# Patient Record
Sex: Female | Born: 1960 | Race: Black or African American | Hispanic: No | Marital: Married | Smoking: Current every day smoker
Health system: Southern US, Community
[De-identification: ages and names within clinical notes are randomized; demographics above are authoritative.]

## PROBLEM LIST (undated history)

## (undated) DIAGNOSIS — I82402 Acute embolism and thrombosis of unspecified deep veins of left lower extremity: Secondary | ICD-10-CM

## (undated) DIAGNOSIS — E669 Obesity, unspecified: Secondary | ICD-10-CM

## (undated) DIAGNOSIS — L089 Local infection of the skin and subcutaneous tissue, unspecified: Secondary | ICD-10-CM

## (undated) DIAGNOSIS — I10 Essential (primary) hypertension: Secondary | ICD-10-CM

## (undated) DIAGNOSIS — J45909 Unspecified asthma, uncomplicated: Secondary | ICD-10-CM

## (undated) DIAGNOSIS — B958 Unspecified staphylococcus as the cause of diseases classified elsewhere: Secondary | ICD-10-CM

## (undated) HISTORY — DX: Unspecified staphylococcus as the cause of diseases classified elsewhere: B95.8

## (undated) HISTORY — DX: Obesity, unspecified: E66.9

## (undated) HISTORY — DX: Acute embolism and thrombosis of unspecified deep veins of left lower extremity: I82.402

## (undated) HISTORY — DX: Essential (primary) hypertension: I10

## (undated) HISTORY — DX: Local infection of the skin and subcutaneous tissue, unspecified: L08.9

---

## 2000-12-23 ENCOUNTER — Other Ambulatory Visit: Admission: RE | Admit: 2000-12-23 | Discharge: 2000-12-23 | Payer: Self-pay | Admitting: Obstetrics and Gynecology

## 2001-02-01 ENCOUNTER — Encounter: Payer: Self-pay | Admitting: *Deleted

## 2001-02-01 ENCOUNTER — Ambulatory Visit (HOSPITAL_COMMUNITY): Admission: RE | Admit: 2001-02-01 | Discharge: 2001-02-01 | Payer: Self-pay | Admitting: *Deleted

## 2001-02-11 ENCOUNTER — Ambulatory Visit (HOSPITAL_COMMUNITY): Admission: RE | Admit: 2001-02-11 | Discharge: 2001-02-11 | Payer: Self-pay | Admitting: *Deleted

## 2001-02-11 ENCOUNTER — Encounter: Payer: Self-pay | Admitting: *Deleted

## 2001-06-16 ENCOUNTER — Encounter: Payer: Self-pay | Admitting: Obstetrics and Gynecology

## 2001-06-16 ENCOUNTER — Ambulatory Visit (HOSPITAL_COMMUNITY): Admission: RE | Admit: 2001-06-16 | Discharge: 2001-06-16 | Payer: Self-pay | Admitting: *Deleted

## 2001-06-27 ENCOUNTER — Inpatient Hospital Stay (HOSPITAL_COMMUNITY): Admission: AD | Admit: 2001-06-27 | Discharge: 2001-06-27 | Payer: Self-pay

## 2001-07-14 ENCOUNTER — Inpatient Hospital Stay (HOSPITAL_COMMUNITY): Admission: AD | Admit: 2001-07-14 | Discharge: 2001-07-16 | Payer: Self-pay | Admitting: Obstetrics and Gynecology

## 2004-12-07 DIAGNOSIS — B958 Unspecified staphylococcus as the cause of diseases classified elsewhere: Secondary | ICD-10-CM

## 2004-12-07 DIAGNOSIS — L089 Local infection of the skin and subcutaneous tissue, unspecified: Secondary | ICD-10-CM

## 2004-12-07 DIAGNOSIS — I82402 Acute embolism and thrombosis of unspecified deep veins of left lower extremity: Secondary | ICD-10-CM

## 2004-12-07 HISTORY — DX: Local infection of the skin and subcutaneous tissue, unspecified: L08.9

## 2004-12-07 HISTORY — PX: TUBAL LIGATION: SHX77

## 2004-12-07 HISTORY — DX: Acute embolism and thrombosis of unspecified deep veins of left lower extremity: I82.402

## 2004-12-07 HISTORY — PX: ABDOMINAL EXPLORATION SURGERY: SHX538

## 2004-12-07 HISTORY — DX: Unspecified staphylococcus as the cause of diseases classified elsewhere: B95.8

## 2005-07-16 ENCOUNTER — Encounter (INDEPENDENT_AMBULATORY_CARE_PROVIDER_SITE_OTHER): Payer: Self-pay | Admitting: Specialist

## 2005-07-16 ENCOUNTER — Inpatient Hospital Stay (HOSPITAL_COMMUNITY): Admission: AD | Admit: 2005-07-16 | Discharge: 2005-07-19 | Payer: Self-pay | Admitting: Obstetrics & Gynecology

## 2005-07-27 ENCOUNTER — Encounter (INDEPENDENT_AMBULATORY_CARE_PROVIDER_SITE_OTHER): Payer: Self-pay | Admitting: *Deleted

## 2005-07-28 ENCOUNTER — Inpatient Hospital Stay (HOSPITAL_COMMUNITY): Admission: EM | Admit: 2005-07-28 | Discharge: 2005-08-12 | Payer: Self-pay | Admitting: Emergency Medicine

## 2005-07-28 ENCOUNTER — Ambulatory Visit: Payer: Self-pay | Admitting: Internal Medicine

## 2005-08-19 ENCOUNTER — Ambulatory Visit: Payer: Self-pay | Admitting: Sports Medicine

## 2005-08-19 ENCOUNTER — Inpatient Hospital Stay (HOSPITAL_COMMUNITY): Admission: AD | Admit: 2005-08-19 | Discharge: 2005-08-21 | Payer: Self-pay | Admitting: Surgery

## 2005-08-24 ENCOUNTER — Ambulatory Visit: Payer: Self-pay | Admitting: Sports Medicine

## 2005-08-26 ENCOUNTER — Ambulatory Visit: Payer: Self-pay | Admitting: Family Medicine

## 2005-10-15 ENCOUNTER — Ambulatory Visit: Payer: Self-pay | Admitting: Family Medicine

## 2005-10-23 ENCOUNTER — Ambulatory Visit: Payer: Self-pay | Admitting: Family Medicine

## 2005-11-03 ENCOUNTER — Ambulatory Visit: Payer: Self-pay | Admitting: Family Medicine

## 2005-11-11 ENCOUNTER — Ambulatory Visit: Payer: Self-pay | Admitting: Family Medicine

## 2005-11-18 ENCOUNTER — Ambulatory Visit: Payer: Self-pay | Admitting: Family Medicine

## 2005-11-25 ENCOUNTER — Ambulatory Visit: Payer: Self-pay | Admitting: Family Medicine

## 2005-12-10 ENCOUNTER — Ambulatory Visit: Payer: Self-pay | Admitting: Family Medicine

## 2005-12-21 ENCOUNTER — Ambulatory Visit: Payer: Self-pay | Admitting: Family Medicine

## 2005-12-29 ENCOUNTER — Ambulatory Visit: Payer: Self-pay | Admitting: Family Medicine

## 2006-01-06 ENCOUNTER — Ambulatory Visit: Payer: Self-pay | Admitting: Family Medicine

## 2006-01-14 ENCOUNTER — Ambulatory Visit: Payer: Self-pay | Admitting: Family Medicine

## 2006-01-21 ENCOUNTER — Ambulatory Visit: Payer: Self-pay | Admitting: Family Medicine

## 2006-01-29 ENCOUNTER — Ambulatory Visit: Payer: Self-pay | Admitting: Family Medicine

## 2006-04-22 ENCOUNTER — Ambulatory Visit: Payer: Self-pay | Admitting: Family Medicine

## 2006-05-14 ENCOUNTER — Encounter: Payer: Self-pay | Admitting: Vascular Surgery

## 2006-05-14 ENCOUNTER — Ambulatory Visit (HOSPITAL_COMMUNITY): Admission: RE | Admit: 2006-05-14 | Discharge: 2006-05-14 | Payer: Self-pay | Admitting: Family Medicine

## 2007-01-25 ENCOUNTER — Ambulatory Visit: Payer: Self-pay | Admitting: Family Medicine

## 2007-01-27 ENCOUNTER — Ambulatory Visit: Payer: Self-pay | Admitting: Family Medicine

## 2007-02-03 DIAGNOSIS — M7120 Synovial cyst of popliteal space [Baker], unspecified knee: Secondary | ICD-10-CM | POA: Insufficient documentation

## 2007-02-04 ENCOUNTER — Ambulatory Visit: Payer: Self-pay | Admitting: Family Medicine

## 2007-02-18 ENCOUNTER — Ambulatory Visit: Payer: Self-pay | Admitting: Family Medicine

## 2007-02-18 DIAGNOSIS — I10 Essential (primary) hypertension: Secondary | ICD-10-CM

## 2007-05-27 ENCOUNTER — Ambulatory Visit: Payer: Self-pay | Admitting: Family Medicine

## 2007-05-27 ENCOUNTER — Encounter: Admission: RE | Admit: 2007-05-27 | Discharge: 2007-05-27 | Payer: Self-pay | Admitting: Sports Medicine

## 2007-05-27 ENCOUNTER — Telehealth: Payer: Self-pay | Admitting: *Deleted

## 2007-05-27 DIAGNOSIS — M2140 Flat foot [pes planus] (acquired), unspecified foot: Secondary | ICD-10-CM | POA: Insufficient documentation

## 2007-05-27 DIAGNOSIS — M21619 Bunion of unspecified foot: Secondary | ICD-10-CM | POA: Insufficient documentation

## 2007-05-27 DIAGNOSIS — M79609 Pain in unspecified limb: Secondary | ICD-10-CM | POA: Insufficient documentation

## 2007-05-30 ENCOUNTER — Encounter (INDEPENDENT_AMBULATORY_CARE_PROVIDER_SITE_OTHER): Payer: Self-pay | Admitting: *Deleted

## 2007-05-30 DIAGNOSIS — IMO0002 Reserved for concepts with insufficient information to code with codable children: Secondary | ICD-10-CM | POA: Insufficient documentation

## 2007-05-30 DIAGNOSIS — M25569 Pain in unspecified knee: Secondary | ICD-10-CM | POA: Insufficient documentation

## 2007-05-30 DIAGNOSIS — M1710 Unilateral primary osteoarthritis, unspecified knee: Secondary | ICD-10-CM

## 2007-05-31 ENCOUNTER — Telehealth: Payer: Self-pay | Admitting: *Deleted

## 2007-06-01 ENCOUNTER — Encounter (INDEPENDENT_AMBULATORY_CARE_PROVIDER_SITE_OTHER): Payer: Self-pay | Admitting: *Deleted

## 2007-06-02 ENCOUNTER — Encounter (INDEPENDENT_AMBULATORY_CARE_PROVIDER_SITE_OTHER): Payer: Self-pay | Admitting: *Deleted

## 2007-06-03 ENCOUNTER — Ambulatory Visit: Payer: Self-pay | Admitting: Family Medicine

## 2007-06-15 ENCOUNTER — Ambulatory Visit: Payer: Self-pay | Admitting: Sports Medicine

## 2007-06-17 ENCOUNTER — Ambulatory Visit: Payer: Self-pay | Admitting: Family Medicine

## 2007-06-17 DIAGNOSIS — L723 Sebaceous cyst: Secondary | ICD-10-CM

## 2007-06-17 DIAGNOSIS — M629 Disorder of muscle, unspecified: Secondary | ICD-10-CM

## 2007-08-09 ENCOUNTER — Emergency Department (HOSPITAL_COMMUNITY): Admission: EM | Admit: 2007-08-09 | Discharge: 2007-08-09 | Payer: Self-pay | Admitting: Emergency Medicine

## 2009-09-14 ENCOUNTER — Encounter (INDEPENDENT_AMBULATORY_CARE_PROVIDER_SITE_OTHER): Payer: Self-pay | Admitting: *Deleted

## 2009-09-14 DIAGNOSIS — F17200 Nicotine dependence, unspecified, uncomplicated: Secondary | ICD-10-CM | POA: Insufficient documentation

## 2011-02-16 ENCOUNTER — Inpatient Hospital Stay (INDEPENDENT_AMBULATORY_CARE_PROVIDER_SITE_OTHER)
Admission: RE | Admit: 2011-02-16 | Discharge: 2011-02-16 | Disposition: A | Discharge status: Home / Self Care | Payer: Self-pay | Source: Ambulatory Visit | Attending: Emergency Medicine | Admitting: Emergency Medicine

## 2011-02-16 DIAGNOSIS — I10 Essential (primary) hypertension: Secondary | ICD-10-CM

## 2011-04-24 NOTE — Discharge Summary (Signed)
NAMELANISHA, STEPANIAN                ACCOUNT NO.:  [id number]   MEDICAL RECORD NO.:  [id number]          PATIENT TYPE:  INP   LOCATION:  5705                         FACILITY:  [facility]   PHYSICIAN:  Melina Fiddler, MD DATE OF BIRTH:  1961/02/21   DATE OF ADMISSION:  08/19/2005  DATE OF DISCHARGE:  08/21/2005                                 DISCHARGE SUMMARY   DIAGNOSIS:  1.  Deep venous thrombosis.  2.  Abdominal wound status post cesarean section four weeks ago.  3.  Hypertension.  4.  Anemia.   MEDICATIONS:  1.  Vicodin 5/500 take 1 tab p.o. q.6h. p.r.n. pain.  2.  Iron 325 mg 1 tab daily.  3.  Senokot S 1 tab daily.  4.  Lovenox 130 subcutaneous twice daily.  5.  Coumadin 7.5 mg p.o. daily.  6.  HCTZ 12.5 mg 1 tab daily.   PROCEDURES:  None.   HOSPITAL COURSE:  The patient was admitted on August 19, 2005, for  swelling of her leg and DVT diagnosed at [facility] by her surgeon, Dr.  Gerrit Friends.  She has a very large abdominal wound extending from just right of  her midline to the axillary line just beneath her pannus as she is fairly  obese.  This is currently dressed with twice a day wet to dry.  A Wound VAC  was considered, but it was determined that it would be difficult to achieve  an appropriate seal.  She was initially started on Coumadin and heparin and  the next day after admission, she was switched over to Lovenox.  She  received samples from the family practice center.  She got teaching from the  pharmacist and felt comfortable administering her own Lovenox.  She will  follow up Coumadin check on September 18.  Admission labs reveal hemoglobin  8, all other labs noncontributory.  The patient developed some high blood  pressure in the hospital and was started on hydrochlorothiazide 12.5 mg.   Problem 1:  Deep venous thrombosis.  The patient was sent out on Lovenox 130  mg subcu b.i.d. and Coumadin 7.5 mg daily, she will follow up on Monday,  September 18, for a  PT check at the [facility].  Her INR was not  therapeutic at discharge.   Problem 2:  Abdominal wound.  The patient will continue the twice daily  wound changes from [facility] nurse, wet to dry.  This problem will be  followed by her surgeon, Dr. Gerrit Friends.   Problem 3:  Hypertension.  The patient will continue 12.5 mg HCTZ daily.   Problem 4:  Anemia.  The patient was sent out on iron 325 mg 1 tab daily.   DISCHARGE LABORATORY DATA:  INR subtherapeutic.  Hemoglobin 8.   FOLLOW UP:  The patient will follow up with Dr. Gerrit Friends in four weeks, she  already has an appointment scheduled.  She will follow up with Dr. Irving Burton  on September 20 at 3 p.m.  She will get a PT/INR checked in the morning on  August 24, 2005.  Angeline Slim, M.D.    ______________________________  Melina Fiddler, MD    AL/MEDQ  D:  08/21/2005  T:  08/21/2005  Job:  [id number]   cc:   Velora Heckler, MD  [address] [place]  [place] [address]

## 2011-04-24 NOTE — Op Note (Signed)
NAMEBRIGHID, [name]                ACCOUNT NO.:  [id number]   MEDICAL RECORD NO.:  [id number]          PATIENT TYPE:  INP   LOCATION:  1408                         FACILITY:  [facility]   PHYSICIAN:  Velora Heckler, MD      DATE OF BIRTH:  [birth year]   DATE OF PROCEDURE:  07/31/2005  DATE OF DISCHARGE:                                 OPERATIVE REPORT   PREOPERATIVE DIAGNOSIS:  Necrotizing abdominal wall wound infection.   POSTOPERATIVE DIAGNOSIS:  Necrotizing abdominal wall wound infection.   PROCEDURE:  Debridement of skin and subcutaneous tissue, drainage of  subcutaneous abscess, open packing of extensive abdominal wall wound.   SURGEON:  Velora Heckler, M.D.   ANESTHESIA:  General.   ESTIMATED BLOOD LOSS:  Minimal.   PREPARATION:  Betadine.   COMPLICATIONS:  None.   INDICATIONS:  The patient is a 50 year old black female with a complex  necrotizing abdominal wall wound infection following cesarean section. She  returns to the operating room for the third time for debridement, drainage,  and open packing of her wound.   DESCRIPTION OF PROCEDURE:  The procedure was done in OR #10 at the [facility]. The patient is brought to the operating room,  placed in a supine position on the operating room table. Following the  administration of general anesthesia, the patient is prepped and draped in  the usual strict aseptic fashion. After ascertaining that an adequate level  of anesthesia had been obtained, the previous packing was extracted from the  wound. The wound was copiously irrigated with warm saline which was  evacuated. The wound does show some signs of improvement with early  granulation tissue formation. Necrotic subcutaneous tissue was debrided  sharply and bluntly. In the left flank, there is still extensive  subcutaneous necrosis and foul-smelling drainage. The wound is probed into  the left flank where there is necrotic adipose tissue which was  debrided.  The skin was opened for approximately 10 cm further into the left flank  using the electrocautery for hemostasis. The cavity was debrided and  hemostasis obtained with the electrocautery. The cavity was irrigated  copiously with warm saline. The entire wound was then packed with a 8-10  rolls of saline moistened Kerlix gauze. No Betadine was used on the  dressings at this point in order to encourage granulation tissue formation.  ABD pads were placed over the wound and Hypafix tape.   The patient is awakened from anesthesia and transported to the recovery  room. The patient tolerated the procedure well.      Velora Heckler, MD  Electronically Signed     TMG/MEDQ  D:  07/31/2005  T:  07/31/2005  Job:  [id number]   cc:   Lenoard Aden, M.D.  [address]  [place]  [place] [address]  Fax: [phone]

## 2011-04-24 NOTE — Discharge Summary (Signed)
[facility]  Patient:    [name], [name]                       MRN: [id number] Adm. Date:  [id number] Disc. Date: 07/16/01 Attending:  Michaele Offer                           Discharge Summary  REASON FOR ADMISSION:         A 50 year old black female, para 1-0-0-1, gravida 2, at 40 weeks by an 8-week ultrasound with [facility] of July 13, 2001, presented for induction with a favorable cervix, and possible large-for-gestational age infant.  PRENATAL LABORATORY DATA:     Blood group and type O positive with a negative antibody, sickle cell negative, RPR nonreactive, rubella immune, hepatitis B surface antigen negative, HIV negative, GC and Chlamydia negative. Triple screen normal, group B strep negative.  PRENATAL CARE:                Prenatal care was complicated by first trimester urinary tract infection treated with Macrobid. Patient was advanced maternal age and declined amniocentesis. She had a normal ultrasound and a normal triple screen. Anemia treated with iron. The patient had size greater than dates with ultrasound on July 11 with estimated fetal weight 2700 grams, AFI 15.3.  OBSTETRIC HISTORY:            In July of 1986 the patient had a spontaneous vaginal delivery at term, delivered a 9-pound 10-ounce baby, no complications.  PAST MEDICAL HISTORY:         Negative.  PAST SURGICAL HISTORY:        Negative.  ALLERGIES:                    No known drug allergies.  MEDICATIONS:                  Iron.  SOCIAL HISTORY:               Married. No tobacco; she quit with pregnancy.  PHYSICAL EXAMINATION:         She was afebrile on admission with normal vital signs. The fetal heart tones were reactive. There were no decelerations, no contractions. The abdomen was gravid, nontender, estimated fetal weight 9 pounds. Vaginal exam deferred. On August 6, cervix was 2-3 cm, 70%, vertex at a -3.  HOSPITAL COURSE:              The patient was  placed on Pitocin. At 12:35 p.m. the fetal heart tones were reactive, contractions were every two to five minutes, cervix was 4 cm, 70%, vertex at a -1 to -2. Artificial rupture of membranes by Dr. Jackelyn Knife produced lots of clear fluid. Fetal scalp electrode was applied. By 4:40 p.m. the patient was comfortable with her epidural, fetal heart tones were reassuring. There were occasional deep variable decelerations. Vaginal exam: the cervix was 9 cm, vertex at a 0 station. The pat progressed to complete dilatation and pushed for two hours. She brought the vertex to a +2 station. Dr. Jackelyn Knife discussed the risks of assisted delivery and proceeded with a vacuum. Bladder was drained. Kiwi cup applied and the vertex was delivered with three pushes. Posterior hand was at the face. Nuchal cord was reduced. Posterior arm was delivered as it was impeding progress. Then the anterior shoulder was easily delivered. The baby was a viable female,  7 pounds 9 ounces, Apgars of 9 at one and 9 at five minutes. The perineum was intact. The placenta was spontaneous and intact. Blood loss was about 500 cc.  Postpartum the patient did quite well and was discharged on the second postpartum day.  LABORATORY DATA:              Initial hemoglobin 12.0, hematocrit 35.9, white count 8700, platelet count 236,000. Followup hemoglobin 10.6, hematocrit 31.4, white count 13,000, RPR nonreactive.  FINAL DIAGNOSIS:              Intrauterine pregnancy at 40 weeks, delivered.  OPERATION:                    Vacuum assisted vaginal delivery.  FINAL CONDITION:              Improved.  DISCHARGE INSTRUCTIONS:       Instructions include our regular discharge instruction booklet.  DISCHARGE MEDICATIONS:        Percocet 5/325, 16 tablets, one every four to six hours as needed for pain.  DISCHARGE FOLLOWUP:           The patient is advised to return to the office in six weeks for followup examination. DD:  07/16/01 TD:   07/16/01 Job: [id number]

## 2011-04-24 NOTE — Op Note (Signed)
[name], [name]                ACCOUNT NO.:  [id number]   MEDICAL RECORD NO.:  [id number]          PATIENT TYPE:  INP   LOCATION:  9103                          [facility]AC[facility]:  [facility]   PHYSICIAN:  Lenoard Aden, M.D.DATE OF BIRTH:  07/16/1961   DATE OF PROCEDURE:  07/16/2005  DATE OF DISCHARGE:                                 OPERATIVE REPORT   PREOPERATIVE DIAGNOSIS:  Postdates intrauterine pregnancy and nonreassuring  fetal heart rate tracing with inability to augment labor.   POSTOPERATIVE DIAGNOSIS:  Postdates intrauterine pregnancy and nonreassuring  fetal heart rate tracing with inability to augment labor.   PROCEDURE:  Primary low segment transverse cesarean section and tubal  ligation.   SURGEON:  Dr. Billy Coast   ANESTHESIA:  Epidural by Dr. Toney Rakes BLOOD LOSS:  1000 mL.   COMPLICATIONS:  None.   DRAINS:  Foley.   COUNTS:  Correct.   SPECIMENS:  Placenta to pathology and tubal segments to pathology.   FINDINGS:  Full-term living female, occiput anterior position, Apgars 9 and 9,  cord blood collected, cord pH noted.  Pediatricians in attendance.   BRIEF OPERATIVE NOTE:  After being apprised of the risks and benefits of  surgery to include small incidence of bowel or bladder injury, injury to  abdominal organs, possible need for repair, failure risk of tubal ligation  of 5-09/999, the patient brought to the operating room where she is  administered dosing of epidural anesthetic without complications, prepped  and draped in usual sterile fashion, Foley catheter placed.  After achieving  adequate anesthesia, dilute Marcaine solution placed, Pfannenstiel skin  incision made with the scalpel, carried down to the fascia, which is nicked  in the midline and opened transversely using Mayo scissors.  Rectus muscle  is dissected sharply, peritoneum entered sharply, bladder blade placed.  Visceroperitoneum scored in a smile-like fashion, dissected sharply off the  lower uterine segment, a Kerr hysterotomy incision made atraumatically with  delivery of a full-term living female, 8 pounds 4 ounces, handed to  pediatricians in attendance, Apgars 9 and 9, cord blood collected, placenta  delivered manually intact.  Three vessel cord noted.  Uterus exteriorized,  closed with one layer with a 0 Monocryl suture.  Good hemostasis noted.  Left tube traced down to fimbriated end, avascular portion of the  mesosalpinx and the __________ isthmic portion of the tube was identified  and cauterized creating a window proximal and distal plane.  Ties are  placed.  Tubal lumens are visualized and cut.  Same procedure that is done  on the left tube is done on the right tube.  Good hemostasis is noted.  Uterus is re-placed in the abdominal cavity,  irrigation accomplished, bladder flap inspected, found to be hemostatic.  Uterine incision inspected.  Fascia closed using a 0 Monocryl in continuous  running fashion.  Subcutaneous tissue closed using a 2-0 plain suture.  Skin  closed using staples.  The patient tolerates the procedure well and is  transferred to recovery in good condition.       RJT/MEDQ  D:  07/16/2005  T:  07/16/2005  Job:  [id number]

## 2011-04-24 NOTE — Op Note (Signed)
[name], [name]                ACCOUNT NO.:  [id number]   MEDICAL RECORD NO.:  [id number]          PATIENT TYPE:  INP   LOCATION:  0101                         FACILITY:  [facility]   PHYSICIAN:  Velora Heckler, MD      DATE OF BIRTH:  Oct 26, 1961   DATE OF PROCEDURE:  07/27/2005  DATE OF DISCHARGE:                                 OPERATIVE REPORT   PREOPERATIVE DIAGNOSIS:  Necrotizing wound infection abdominal wall.   POSTOPERATIVE DIAGNOSIS:  Necrotizing wound infection abdominal wall.   PROCEDURE:  Debridement of skin, subcutaneous tissue and muscle, drainage,  and open packing of wound infection abdominal wall.   SURGEON:  Velora Heckler, MD   ANESTHESIA:  General.   ESTIMATED BLOOD LOSS:  Minimal.   PREPARATION:  Betadine.   COMPLICATIONS:  None.   INDICATIONS:  The patient is 50 year old black female 11 days status post  cesarean section. The patient developed signs and symptoms of wound  infection 5 days ago. She was placed on oral Augmentin. However, infection  has progressed to the point of necrotizing infection of the skin with  ulceration, drainage and spreading cellulitis. The patient was seen by Dr.  Olivia Mackie and referred to general surgery for evaluation and  management.   DESCRIPTION OF PROCEDURE:  The patient was brought from the emergency  department directly to the operating room. After induction of general  anesthesia, the patient's abdominal wall was prepped and draped in the usual  strict aseptic fashion. There is an approximately 15 cm necrotic ulcerated  lesion in the midline of the lower abdominal wall. This was debrided using  the electrocautery. Upon entering the subcutaneous tissues, there was gray  cloudy fluid throughout the tissues. There was frank necrosis of the  underlying adipose tissue. There was a large pocket of fluid present in the  previous Pfannenstiel wound. This was debrided down to the muscular wall and  necrotic muscle,  necrotic adipose tissue, and necrotic skin were all  debrided from the wound and submitted to pathology for review. The wound  infection tracks laterally both to the right and to the left. It tracks to  the left flank in a greater degree. Therefore the overlying skin of the  abdominal wall was opened laterally. The extent of the area of necrosis. The  wound was debrided. Hemostasis was obtained with electrocautery. Using pulse  lavage, 3000 mL of saline were used to irrigate and debride the remainder of  the wound. Aerobic and anaerobic cultures were  previously submitted. The wound was packed with six Betadine soaked Kerlix  gauze rolls. These were covered with ABD pads and then Hypafix tape. The  patient is awakened from anesthesia and brought to the recovery room in  stable condition. The patient tolerated the procedure well.      Velora Heckler, MD  Electronically Signed     TMG/MEDQ  D:  07/27/2005  T:  07/28/2005  Job:  [id number]   cc:   Lenoard Aden, M.D.  [address]  [place]  [place] [address]  Fax: [phone]

## 2011-04-24 NOTE — Discharge Summary (Signed)
[name], [name]                ACCOUNT NO.:  [id number]   MEDICAL RECORD NO.:  [id number]          PATIENT TYPE:  INP   LOCATION:  9103                          FACILITY:  [facility]   PHYSICIAN:  Lenoard Aden, M.D.DATE OF BIRTH:  Apr 09, 1961   DATE OF ADMISSION:  07/16/2005  DATE OF DISCHARGE:  07/19/2005                                 DISCHARGE SUMMARY   The patient underwent an uncomplicated primary C-section for nonreassuring  fetal heart rate tracing on July 16, 2005.  She underwent a primary C-  section and tubal ligation at that time, which were uncomplicated.  Her  postpartum course was uncomplicated.  She was discharged to home on day 33.  Discharge teaching ws done.  She was given a prescription for Tylox,  prenatal vitamins, and Motrin.  She was to follow up in the office in 2 days  due to morbid obesity and for staple removal.      Lenoard Aden, M.D.  Electronically Signed     RJT/MEDQ  D:  08/31/2005  T:  09/01/2005  Job:  [id number]   cc:   Marvel Plan

## 2011-04-24 NOTE — Op Note (Signed)
NAMECHERLY, ERNO                ACCOUNT NO.:  [id number]   MEDICAL RECORD NO.:  [id number]          PATIENT TYPE:  INP   LOCATION:  1408                         [facility]ACILITY:  [facility]   PHYSICIAN:  Velora Heckler, MD      DATE OF BIRTH:  1961-01-29   DATE OF PROCEDURE:  08/02/2005  DATE OF DISCHARGE:                                 OPERATIVE REPORT   PREOPERATIVE DIAGNOSIS:  Necrotizing abdominal wound infection.   POSTOPERATIVE DIAGNOSIS:  Necrotizing abdominal wound infection.   PROCEDURE:  1.  Sharp debridement of abdominal wall wound.  2.  Dressing change of extensive abdominal wall wound under anesthesia.   SURGEON:  Velora Heckler, M.D.   ASSISTANT:  None.   ANESTHESIA:  General per Quentin Cornwall. Council Mechanic, M.D.   ESTIMATED BLOOD LOSS:  Minimal.   PREPARATION:  Betadine.   COMPLICATIONS:  None.   INDICATIONS:  The patient is a 50 year old black female who developed a  necrotizing abdominal wall wound infection following cesarean section. The  patient has required extensive debridement and drainage procedure. She has a  very large abdominal wound which involves the subcutaneous compartment  extensively. She returns to the operating room for debridement and dressing  change.   DESCRIPTION OF PROCEDURE:  The procedure was done in OR #1 at the [facility]. The patient is brought to the operating room,  placed in supine position on the operating room table. Following the  administration of general anesthesia, the patient's abdomen was prepped and  draped in the usual strict aseptic fashion. Previous packing of the  abdominal wound is all extracted and discarded. There is early granulation  tissue throughout approximately 95% of the wound. There is a small amount of  purulent drainage still present in the left flank. This is debrided. A small  cavity was opened a little more extensively deeper into the left flank. This  was irrigated copiously with saline.  Necrotic tissue was debrided sharply  with the Metzenbaum scissors from multiple locations around the base of the  wound. The wound was then irrigated throughout its extent with warm saline.  Saline  soaked Kerlix gauze was then inserted. Eight rolls of Kerlix were used  within the wound. ABD pads were placed and secured with Hypafix tape. The  patient was awakened from anesthesia and brought to the recovery room. The  patient tolerated the procedure well.      Velora Heckler, MD  Electronically Signed     TMG/MEDQ  D:  08/02/2005  T:  08/03/2005  Job:  [id number]   cc:   Lenoard Aden, M.D.  [address]  [place]  [place] [address]  Fax: [phone]

## 2011-04-24 NOTE — H&P (Signed)
[name], [name]                ACCOUNT NO.:  [id number]   MEDICAL RECORD NO.:  [id number]          PATIENT TYPE:  INP   LOCATION:  5705                         FACILITY:  [facility]   PHYSICIAN:  Melina Fiddler, MD DATE OF BIRTH:  [birth year]   DATE OF ADMISSION:  08/19/2005  DATE OF DISCHARGE:                                HISTORY & PHYSICAL   CHIEF COMPLAINT:  Lower extremity swelling and pain.   HISTORY OF PRESENT ILLNESS:  This is a 50 year old, very sweet, African  American female, status post C-section with extensive wound cellulitis who  was recently admitted for her wound cellulitis, who has now developed  extensive left lower extremity DVT.  The DVT extends from her ankle through  to her groin.  On the day prior to discharge from her previous admission,  she complained of pain in her thigh but this pain was thought to be  secondary to prolonged bed rest.  Several days later, she began to develop  swelling in her calf and thigh and significant worsening pain.  She  presented to her primary surgeon several days later and now follows up at  the ultrasound lab for evaluation of possible DVT which was found to be  positive.  The patient has multiple risk factors for DVT including positive  tobacco use, decreased ambulation, recent pregnancy, history of infection  and recent surgery, also recent decreased ambulation.  The patient denies  any shortness of breath, any chest pain, or any other complications  secondary to this.   PAST MEDICAL HISTORY:  C-section with post-op cellulitis and wound infection  with multiple I&Ds in the operating room.   ALLERGIES:  No known drug allergies.   MEDICINES:  Vicodin 5/500, one to two tabs p.o. q.4-6h. p.r.n. for wound  changes.   SOCIAL HISTORY:  Smokes one and a half packs per day.  No alcohol.  No  drugs.  She is a Administrator, and she is married.  She has three  children.  She has a 73-year-old, a 46 and a 4-month-old boy.   They are all  boys.   FAMILY HISTORY:  No family history of blood clots.   REVIEW OF SYSTEMS:  An eight-system review of systems is all negative.   PHYSICAL EXAMINATION:  VITAL SIGNS:  Within normal limits.  The patient is  afebrile.  GENERAL:  The patient is tearful secondary to needing rehospitalization.  CV:  Regular rate and rhythm.  No murmurs, rubs, or gallops.  LUNGS:  Clear to auscultation bilaterally.  No wheezes, crackles, or rales.  ABDOMEN:  Positive bowel sounds and is obese and there is a large midline  incision that extends to the axilla on the left.  The wound has pink  granulation tissue with no drainage.  HEENT:  Eyes are PERRLA.  EOMI.  Throat shows no exudates and there is no  lymphadenopathy.  LOWER EXTREMITIES:  Have 3+ pitting edema on the left with palpable  posterior cord.  There is 2+ pitting edema on the right.  No clubbing,  cyanosis.  NEURO:  Cranial nerves II-XII  are grossly intact.   LABS:  No laboratory values yet obtained but lower extremity Doppler studies  revealed DVT from the ankle to the groin on the left with tibial, popliteal,  and femoral veins occluded.   ASSESSMENT AND PLAN:  1.  Deep vein thrombosis.  The patient has multiple risk factors for deep      vein thrombosis including tobacco use, history of a recent infection,      recent surgery, and recent decreased ambulation, also a recent      pregnancy, and obesity.  I will admit the patient for heparin and      Coumadin therapy and possibly Lovenox therapy as an outpatient if      available.  2.  Wound.  Twice daily dressing changes wet-to-dry dressing.  3.  Smoking cessation would help with wound healing.      Penni Bombard, MD    ______________________________  Melina Fiddler, MD    SJ/MEDQ  D:  08/19/2005  T:  08/19/2005  Job:  [id number]

## 2011-04-24 NOTE — H&P (Signed)
[name], [name]                ACCOUNT NO.:  [id number]   MEDICAL RECORD NO.:  [id number]          PATIENT TYPE:  INP   LOCATION:  9171                          FACILITY:  [facility]   PHYSICIAN:  Lenoard Aden, M.D.DATE OF BIRTH:  March 03, 1961   DATE OF ADMISSION:  07/16/2005  DATE OF DISCHARGE:                                HISTORY & PHYSICAL   CHIEF COMPLAINT:  Post dates induction __________.   HISTORY OF PRESENT ILLNESS:  Patient is a 50 year old African-American  female G3, P2, EDD of July 12, 2005 at 40-4/7 weeks for induction.  She has  no known drug allergies.   MEDICATIONS:  Prenatal vitamins.   She has a history of a 9 pound 10 ounce female born in 40 and a 7 pound 11  ounce female born in 2002, both reportedly without complications with epidural  anesthesia.   FAMILY HISTORY:  Hypertension, diabetes.   She is a nonsmoker, nondrinker.  She denies domestic or physical violence.   PHYSICAL EXAMINATION:  GENERAL:  Patient is a morbidly obese African-  American female in no acute distress.  HEENT:  Normal.  LUNGS:  Clear.  HEART:  Regular rate and rhythm.  ABDOMEN:  Soft, gravid, nontender.  Estimated fetal weight is 9 pounds.  PELVIC:  Cervix is 2 cm, 50%, vertex, -2.  EXTREMITIES:  No cords.  NEUROLOGIC:  Nonfocal.   IMPRESSION:  1.  A 41-week intrauterine pregnancy.  2.  Post dates induction.   PLAN:  Proceed with induction.  Risks, benefits discussed.  Anticipate  cautious attempts at vaginal delivery.       RJT/MEDQ  D:  07/16/2005  T:  07/16/2005  Job:  [id number]

## 2011-04-24 NOTE — Discharge Summary (Signed)
NAMEMONASIA, LAIR                ACCOUNT NO.:  [id number]   MEDICAL RECORD NO.:  [id number]          PATIENT TYPE:  INP   LOCATION:  1408                         FAC[facility]   PHYSICIAN:  Velora Heckler, MD      DATE OF BIRTH:  12-28-1960   DATE OF ADMISSION:  07/27/2005  DATE OF DISCHARGE:  08/12/2005                                 DISCHARGE SUMMARY   REASON FOR ADMISSION:  Necrotizing infection of abdominal wall.   HISTORY OF PRESENT ILLNESS:  The patient is a pleasant, 50 year old, black  female from [place], [place], who presents to the emergency  department at the request of Dr. Olivia Mackie for surgical evaluation of  necrotizing infection of the abdominal wall.  The patient had undergone  cesarean section 11 days prior to presentation.  She developed fever and  cellulitis.  She was treated with oral Augmentin.  However, the infection  progressed with persistent fever and development of a large necrotizing  ulcer on the lower abdominal wall with foul-smelling drainage.  The patient  was sent to [facility] for surgical assessment.   HOSPITAL COURSE:  The patient was seen and evaluated in the emergency  department.  She was immediately prepared and taken to the operating room  where she underwent operative debridement of skin and subcutaneous tissues  and her previous cesarean section wound.  The patient required repeated  returns to the operating room for debridement and extension of the abdominal  wound.  This involves skin and subcutaneous tissues.  Muscle wall was intact  and without necrosis.  The patient was seen in consultation by Dr. Cliffton Asters and Dr. Burnice Logan from infectious disease.  She was treated  initially with intravenous vancomycin and Primaxin.  Antibiotic coverage was  narrowed and then discontinued as the patient's clinical course improved.  Dressing changes were initiated on the floor after the fourth trip to the  operating room.  She received pulse lavage from physical therapy's  hydrotherapy program.  The patient continued to make steady progress.  Antibiotics were discontinued.  The patient's wound was seen in consultation  by Dr. Pleas Patricia from the wound care service and plastic surgery.  Recommendations for care were made.  Home Health was called to assess the  patient.  The patient is prepared for discharge home on August 12, 2005.   DISPOSITION:  The patient will be discharged home today, August 12, 2005,  in good condition, tolerating her regular diet, ambulating independently.   WOUND CARE:  [facility] will provide dressing changes to the  wound twice daily.   DISCHARGE MEDICATIONS:  1.  Vicodin as needed for pain.  2.  Home medications as per usual.   FOLLOW UP:  The patient will be seen back in my office at [facility] for a wound check in 1 week.      Velora Heckler, MD  Electronically Signed     TMG/MEDQ  D:  08/12/2005  T:  08/12/2005  Job:  [id number]   cc:   Herschell Dimes.  Billy Coast, M.D.  [address]  [place]  [place] [address]  Fax: [phone]

## 2011-04-24 NOTE — Consult Note (Signed)
[name], [name]                ACCOUNT NO.:  [id number]   MEDICAL RECORD NO.:  [id number]          PATIENT TYPE:  INP   LOCATION:  1408                         FACILITY:  [facility]   PHYSICIAN:  Consuello Bossier., M.D.DATE OF BIRTH:  11/06/1961   DATE OF CONSULTATION:  08/04/2005  DATE OF DISCHARGE:                                   CONSULTATION   WOUND CARE CONSULTATION:  We were asked to see this 50 year old female  patient by Dr. Gerrit Friends.  The patient was admitted on July 27, 2005, for the  treatment of a necrotizing abdominal wound infection following a cesarean  section on July 16, 2005.  The patient has undergone extensive incision  and drainage and debridement of the lower abdominal wounds on August 21,  August 23, August 25 and August 27.  She is still febrile.  She is a smoker  but has otherwise been healthy with no long-term medical problems, no  history of diabetes.   PHYSICAL EXAMINATION:  An extensive lower abdominal transverse open wound in  this markedly obese 50 year old female.  She has some surrounding abdominal  wall and suprapubic edema.  The packing has an extensive yellow and green  drainage.  The packing was removed, and she had developing granulation  tissue throughout with some various pockets coursing in the very thick  adipose tissue.   Our recommendation is to continue her aggressive antibiotic therapy and  promote the drainage that has been accomplished with her multiple surgical  procedures.  I do not think there is any active evidence of necrotic tissue  which seemed to require further debridement at this point.  Hopefully, once  the infectious process has subsided, this wound can be allowed to heal  secondarily and the V.A.C. therapy could be considered possibly at that  point to expedite that process.  In the meantime, we would suggest daily  b.i.d. dressing changes, one of these by physical therapy with their  addition of pulse  lavage.      Consuello Bossier., M.D.  Electronically Signed     HH/MEDQ  D:  08/04/2005  T:  08/04/2005  Job:  [id number]

## 2011-04-24 NOTE — Op Note (Signed)
[name], [name]                ACCOUNT NO.:  [id number]   MEDICAL RECORD NO.:  [id number]          PATIENT TYPE:  INP   LOCATION:  1408                         FACILITY:  [facility]   PHYSICIAN:  Velora Heckler, MD      DATE OF BIRTH:  01-Jun-1961   DATE OF PROCEDURE:  07/29/2005  DATE OF DISCHARGE:                                 OPERATIVE REPORT   PREOPERATIVE DIAGNOSIS:  Necrotizing abdominal wall wound infection.   POSTOPERATIVE DIAGNOSIS:  Necrotizing abdominal wall wound infection.   PROCEDURE:  Debridement and drainage of abdominal wall abscesses with open  dressing change to wounds.   SURGEON:  Velora Heckler, M.D.   ANESTHESIA:  General.   ESTIMATED BLOOD LOSS:  Minimal.   PREPARATION:  Betadine.   COMPLICATIONS:  None.   INDICATIONS:  The patient is a 50 year old black female with a necrotizing  infection of an abdominal wall wound following cesarean section. The patient  underwent debridement and open packing 48 hours ago. She now returns to the  operating room for repeat exploration and dressing change.   DESCRIPTION OF PROCEDURE:  The procedure was done in OR #12 at the [facility]. The patient is brought to the operating room,  placed in supine position on the operating room table. Following the  administration of general anesthesia, the patient is prepped and draped in  the usual strict aseptic fashion. After ascertaining that an adequate level  of anesthesia had been obtained, the previous packings were removed from the  extensive lower abdominal wound. Wound was explored. There appears to be  areas of necrosis and purulent drainage extending around the left abdominal  wall into the left flank. Using the electrocautery, the skin was opened for  another 20 cm into the left lateral abdominal wall. Dissection was carried  through the subcutaneous tissues down to the abdominal musculature.  Extensive cavities of necrosis purulent fluid are  entered. These are  debrided sharply. Hemostasis was obtained with the electrocautery. The  cavities were irrigated copiously with warm saline which was evacuated. The  remainder of the wound was also explored. Two other areas of necrosis and  purulent fluid were opened widely and debrided. The entire wound was then  irrigated with warm saline which was evacuated. Good hemostasis was noted.  The wound was packed open with Betadine-soaked Kerlix gauze. Eight entire  rolls of Kerlix  were placed into this extensive abdominal wall wound. The wound was then  dressed with ABD pads and Hypafix tape. The patient was awakened from  anesthesia and returned to the recovery room. She tolerated the procedure  well.      Velora Heckler, MD  Electronically Signed     TMG/MEDQ  D:  07/29/2005  T:  07/29/2005  Job:  [id number]   cc:   Lenoard Aden, M.D.  [address]  [place]  [place] [address]  Fax: [phone]

## 2011-04-24 NOTE — H&P (Signed)
[name], [name]                ACCOUNT NO.:  [id number]   MEDICAL RECORD NO.:  [id number]          PATIENT TYPE:  EMS   LOCATION:  ED                           FACILITY:  [facility]   PHYSICIAN:  Velora Heckler, MD      DATE OF BIRTH:  11-12-1961   DATE OF ADMISSION:  07/27/2005  DATE OF DISCHARGE:                                HISTORY & PHYSICAL   REFERRING PHYSICIAN:  Lenoard Aden, M.D.   CHIEF COMPLAINT:  Necrotizing infection, abdominal wall.   HISTORY OF PRESENT ILLNESS:  [name] is a pleasant 50 year old black  female accompanied by her husband and multiple family members.  She presents  at the request of Dr. Olivia Mackie for necrotizing infection of the  abdominal wall.  The patient underwent cesarean section 11 days ago.  Five  days ago, she developed what appeared to be a cellulitis on the lower  abdominal wall.  She was treated with oral Augmentin.  Today she returns to  the office with fever and a large necrotizing ulcer of the lower abdominal  wall with foul-smelling drainage.  The patient is referred to general  surgery for management by Dr. Billy Coast.   PAST MEDICAL HISTORY:  Status post cesarean section.   MEDICATIONS:  Augmentin x4 days.   ALLERGIES:  None known.   SOCIAL HISTORY:  Patient worked at [employer] until her recent  pregnancy.  She is married.  She has three children.  She smokes less than a  pack of cigarettes a day.  She drinks alcohol on occasion.   REVIEW OF SYSTEMS:  The 15-system review without significant positives.   FAMILY HISTORY:  No significant findings on family history.  No history of  bleeding disorders.  No history of complications with the anesthesia or  surgery.   PHYSICAL EXAMINATION:  GENERAL:  A 50 year old moderately obese black female  in the emergency department at [facility].  VITAL SIGNS:  Temp 102.5, blood pressure 139/85, pulse 115, respirations 22,  O2 saturation 99% on room air.  HEENT:  Normocephalic and atraumatic.  Sclerae are clear.  Conjunctivae  clear.  Pupils 3 mm bilaterally and reactive.  NECK:  Palpation of the neck shows no masses.  There is no thyroid  nodularity.  There is no lymphadenopathy.  There is no tenderness.  LUNGS:  Clear to auscultation bilaterally without rales, wheezes, or  rhonchi.  HEART:  Tachycardia but without murmur.  Peripheral pulses are full.  ABDOMEN:  A large necrotizing ulcer measuring at least 15 cm in diameter in  the lower midline of the abdomen.  This is on the anterior surface of her  pannus.  There is induration and cellulitis tracking well around into the  left flank posteriorly.  Indeed, in a sitting position, there is tenderness  at the costovertebral angle.  The Pfannenstiel incision appears intact.  There is foul-smelling drainage from the necrotic ulcer.  The remainder of  the abdomen is soft without distention.  EXTREMITIES:  Nontender without edema.  NEUROLOGIC:  The patient is alert and oriented without  focal deficit.   LABORATORY STUDIES:  White count 13.8, hemoglobin 9.5, platelet count  452,000, neutrophils 80%, lymphocytes 12%, monocytes 8%.  Prothrombin time  15 with an INR of 1.2.  Electrolytes are within normal limits.  Liver  function tests are within normal limits.   RADIOGRAPHIC STUDIES:  None.   A 12-lead EKG pending.   IMPRESSION:  Acute necrotizing infection of lower abdominal wall with  cellulitis.   PLAN:  I discussed the seriousness of the situation with the patient and her  family.  I expressed my desire to proceed urgently to the operating room for  debridement.  I have started her on intravenous Primaxin.  We will obtain  cultures, both for anaerobic and aerobic bacteria in the operating room.  I  cautioned the family that a large surgical wound may be necessary, and I  would be unable to primarily close this wound.  Patient may require CT scan  of the abdomen and pelvis to rule out  intra-abdominal infection after the  initial debridement of the abdominal wall.  The patient and her family  understand this plan and wish to proceed.  We will make arrangements with  the operating room immediately.      Velora Heckler, MD  Electronically Signed     TMG/MEDQ  D:  07/27/2005  T:  07/27/2005  Job:  [id number]   cc:   Lenoard Aden, M.D.  [address]  [place]  [place] [address]  Fax: [phone]

## 2011-04-24 NOTE — Op Note (Signed)
[facility]  Patient:    [name], [name]                       MRN: [id number] Proc. Date: 07/14/01 Adm. Date:  [id number] Attending:  Michaele Offer                           Operative Report  SURGEON:                      Zenaida Niece, M.D.  PROCEDURE IN DETAIL:          The patient is a 50 year old black female admitted for induction of labor at 40 weeks with a favorable cervix.  SHe progressed through active labor, reached complete and pushed for approximately two hours.  She brought the vertex only to a +2 station, but if felt that this was mostly due to poor pushing effort.  Fetal heart tracing was reassuring with mild variable decelerations with contractions.  Estimated fetal weight was approximately 9 lb, although the patient felt this baby was smaller than her previous baby, which weighed 9 lb 10 oz.  The risks of assisted delivery due to prolonged second stage were discussed with the patient and she agreed to proceed.  She did have adequate epidural anesthesia.  Her bladder was drained with a red rubber catheter.  The Kiwi self-contained vacuum device was applied to the vertex and suction was applied.  With her next contraction, she pushed well and, with three pushes, we were able to deliver the vertex without much traction at all.  There was mild turtling of the head against the perineum.  The posterior hand was beside the babys face.  This impeded delivery of the anterior shoulder.  A loose nuchal cord was reduced and the posterior arm was then easily delivered, followed by the anterior shoulder. The remainder of the infant delivered atraumatically.  The mouth and nares were suctioned.  The cord was doubly clamped and cut and the infant handed to the awaiting mother.  Cord blood was obtained and the placenta delivered spontaneously.   The perineum was intact and the uterus firmed up nicely. Estimated blood loss was  approximately 500 cc.  This was a viable female infant with Apgars of 9 and 9 with a weight pending at the time of dictation.  The baby moved its right arm well, which was the posterior arm, and did appear to have some decreased movement of the left arm, which was the anterior arm. This will be watched in the nursery.  The mother and baby were recovering well. DD:  07/14/01 TD:  07/15/01 Job: [id number]

## 2011-09-18 LAB — WOUND CULTURE

## 2014-01-31 ENCOUNTER — Encounter: Payer: Self-pay | Admitting: Medical

## 2014-01-31 ENCOUNTER — Ambulatory Visit (INDEPENDENT_AMBULATORY_CARE_PROVIDER_SITE_OTHER): Payer: BC Managed Care – PPO | Admitting: Medical

## 2014-01-31 VITALS — BP 172/110 | HR 88 | Temp 98.0°F | Resp 16 | Ht 63.2 in | Wt 286.0 lb

## 2014-01-31 DIAGNOSIS — Z23 Encounter for immunization: Secondary | ICD-10-CM

## 2014-01-31 DIAGNOSIS — E669 Obesity, unspecified: Secondary | ICD-10-CM

## 2014-01-31 DIAGNOSIS — Z124 Encounter for screening for malignant neoplasm of cervix: Secondary | ICD-10-CM

## 2014-01-31 DIAGNOSIS — Q829 Congenital malformation of skin, unspecified: Secondary | ICD-10-CM

## 2014-01-31 DIAGNOSIS — F17200 Nicotine dependence, unspecified, uncomplicated: Secondary | ICD-10-CM

## 2014-01-31 DIAGNOSIS — Z1239 Encounter for other screening for malignant neoplasm of breast: Secondary | ICD-10-CM

## 2014-01-31 DIAGNOSIS — Z0000 Encounter for general adult medical examination without abnormal findings: Secondary | ICD-10-CM

## 2014-01-31 DIAGNOSIS — Z1211 Encounter for screening for malignant neoplasm of colon: Secondary | ICD-10-CM

## 2014-01-31 DIAGNOSIS — R030 Elevated blood-pressure reading, without diagnosis of hypertension: Secondary | ICD-10-CM

## 2014-01-31 DIAGNOSIS — K029 Dental caries, unspecified: Secondary | ICD-10-CM

## 2014-01-31 DIAGNOSIS — Q849 Congenital malformation of integument, unspecified: Secondary | ICD-10-CM

## 2014-01-31 LAB — COMPREHENSIVE METABOLIC PANEL
ALBUMIN: 4 g/dL (ref 3.5–5.2)
ALK PHOS: 67 U/L (ref 39–117)
ALT: 14 U/L (ref 0–35)
AST: 15 U/L (ref 0–37)
BUN: 9 mg/dL (ref 6–23)
CALCIUM: 9.1 mg/dL (ref 8.4–10.5)
CHLORIDE: 104 meq/L (ref 96–112)
CO2: 25 mEq/L (ref 19–32)
CREATININE: 0.89 mg/dL (ref 0.50–1.10)
GLUCOSE: 85 mg/dL (ref 70–99)
POTASSIUM: 3.9 meq/L (ref 3.5–5.3)
Sodium: 135 mEq/L (ref 135–145)
Total Bilirubin: 0.5 mg/dL (ref 0.2–1.2)
Total Protein: 7.1 g/dL (ref 6.0–8.3)

## 2014-01-31 LAB — POCT URINALYSIS DIPSTICK
Bilirubin, UA: NEGATIVE
Glucose, UA: NEGATIVE
Ketones, UA: NEGATIVE
LEUKOCYTES UA: NEGATIVE
Nitrite, UA: NEGATIVE
PH UA: 6
Protein, UA: NEGATIVE
Spec Grav, UA: 1.01
Urobilinogen, UA: NEGATIVE

## 2014-01-31 LAB — HEMOGLOBIN A1C
Hgb A1c MFr Bld: 5.6 % (ref <5.7)
Mean Plasma Glucose: 114 mg/dL (ref <117)

## 2014-01-31 LAB — CBC
HEMATOCRIT: 36.7 % (ref 36.0–46.0)
HEMOGLOBIN: 11.4 g/dL — AB (ref 12.0–15.0)
MCH: 21.3 pg — ABNORMAL LOW (ref 26.0–34.0)
MCHC: 31.1 g/dL (ref 30.0–36.0)
MCV: 68.6 fL — AB (ref 78.0–100.0)
Platelets: 237 10*3/uL (ref 150–400)
RBC: 5.35 MIL/uL — ABNORMAL HIGH (ref 3.87–5.11)
RDW: 19.3 % — AB (ref 11.5–15.5)
WBC: 8.7 10*3/uL (ref 4.0–10.5)

## 2014-01-31 LAB — LIPID PANEL
CHOL/HDL RATIO: 3.2 ratio
Cholesterol: 184 mg/dL (ref 0–200)
HDL: 58 mg/dL (ref >39)
LDL Cholesterol: 104 mg/dL — ABNORMAL HIGH (ref 0–99)
TRIGLYCERIDES: 109 mg/dL (ref <150)
VLDL: 22 mg/dL (ref 0–40)

## 2014-01-31 LAB — TSH: TSH: 1.238 u[IU]/mL (ref 0.350–4.500)

## 2014-01-31 NOTE — Progress Notes (Signed)
Subjective:   HPI  [name] is a 53 y.o. female who presents for a complete physical.  New patient today.  I recently saw her husband as a new patient.  [facility]  Preventative care: Last ophthalmology visit:yes- Dr. Nile RiggsShapiro Last dental visit: n/a Last colonoscopy: n/a Last mammogram:n/a Last gynecological exam:years ago Last EKG: n/a Last labs:years ago  Prior vaccinations: TD or Tdap:2006 Influenza:2014 Pneumococcal:n/a Shingles/Zostavax:n/a  Advanced directive:n/a Health care power of attorney:n/a Living will:n/a  Concerns: Growing brown raised skin lesion on upper middle of chest. Wants this cut off.  Worried it keeps growing.  Hasn't seen dentist in a few years, has some known tooth decay.  No hx/o high blood pressure.  Doesn't check her BP  Smokes about 1/2 ppd.  Hasn't been able to quit, but hasn't superficially tried to quit.   Reviewed their medical, surgical, family, social, medication, and allergy history and updated chart as appropriate.  Past Medical History  Diagnosis Date  . Obesity   . Staphylococcal infection of skin 2006    1 month hospitalization, s/p C section  . Left leg DVT 2006    s/p C-section, 1 month hospitaliation for staph infection and period of immobility    Past Surgical History  Procedure Laterality Date  . Abdominal exploration surgery  2006    staph infection s/p C-section    History   Social History  . Marital Status: Married    Spouse Name: N/A    Number of Children: N/A  . Years of Education: N/A   Occupational History  . Not on file.   Social History Main Topics  . Smoking status: Current Every Day Smoker -- 0.25 packs/day for 20 years    Types: Cigarettes  . Smokeless tobacco: Not on file  . Alcohol Use: No  . Drug Use: No  . Sexual Activity: Not on file   Other Topics Concern  . Not on file   Social History Narrative   Married, has 3 children, works for [employer], [place], no exercise    Family History  Problem Relation Age of Onset  . Hypertension Mother   . Diabetes Mother   . Kidney disease Father     transplant, and subsequent failure   . Pneumonia Father     died of pneumonia  . Hypertension Father   . Cancer Brother     stomach  . Heart disease Neg Hx   . Stroke Neg Hx   . Alcohol abuse Brother   . Ulcers Brother     bleeding ulcer related to alcohol abuse    No current outpatient prescriptions on file.  No Known Allergies   Review of Systems Constitutional: -fever, -chills, +sweats, -unexpected weight change, -decreased appetite, -fatigue Allergy: -sneezing, -itching, -congestion Dermatology: -changing moles, --rash, +lumps ENT: -runny nose, -ear pain, -sore throat, -hoarseness, -sinus pain, -teeth pain, - ringing in ears, -hearing loss, -nosebleeds Cardiology: -chest pain, -palpitations, -swelling, -difficulty breathing when lying flat, -waking up short of breath Respiratory: -cough, -shortness of breath, -difficulty breathing with exercise or exertion, -wheezing, -coughing up blood Gastroenterology: -abdominal pain, -nausea, -vomiting, -diarrhea, -constipation, -blood in stool, -changes in bowel movement, -difficulty swallowing or eating Hematology: -bleeding, -bruising  Musculoskeletal: -joint aches, -muscle aches, -joint swelling, -back pain, -neck pain, -cramping, -changes in gait Ophthalmology: denies vision changes, eye redness, itching, discharge Urology: -burning with urination, -difficulty urinating, -blood in urine, -urinary frequency, -urgency, -incontinence Neurology: -headache, -weakness, -  tingling, -numbness, -memory loss, -falls, -dizziness Psychology: -depressed mood, -agitation, -sleep problems     Objective:   Physical Exam  BP 172/110  Pulse 88  Temp(Src) 98 F (36.7 C) (Oral)  Resp 16  Ht 5' 3.2" (1.605 m)  Wt 286 lb (129.729 kg)  BMI 50.36 kg/m2   General appearance:  alert, no distress, WD/WN, obese AA female Skin: middle of upper chest with 1.3cm diameter by 1cm raised brown hard lesion with some raised lumps in the base with surrounding brown coloration, lower right back with horizontal brown linear birth mark, no other particular worrisome lesions, several skin tags in neck and axilla bilat HEENT: normocephalic, conjunctiva/corneas normal, sclerae anicteric, PERRLA, EOMi, nares patent, no discharge or erythema, pharynx normal Oral cavity: MMM, tongue normal, teeth - several molars upper and lower with obvious decay Neck: supple, no lymphadenopathy, no thyromegaly, no masses, normal ROM, no bruits Chest: non tender, normal shape and expansion Heart: RRR, normal S1, S2, no murmurs Lungs: CTA bilaterally, no wheezes, rhonchi, or rales Abdomen: +bs, soft, large lower left abdomen surgical scar from midline to left flank LLQ, otherwise non tender, non distended, no masses, no hepatomegaly, no splenomegaly, no bruits Back: non tender, normal ROM, no scoliosis Musculoskeletal: upper extremities non tender, no obvious deformity, normal ROM throughout, lower extremities non tender, no obvious deformity, normal ROM throughout Extremities: no edema, no cyanosis, no clubbing Pulses: 2+ symmetric, upper and lower extremities, normal cap refill Neurological: alert, oriented x 3, CN2-12 intact, strength normal upper extremities and lower extremities, sensation normal throughout, DTRs 2+ throughout, no cerebellar signs, gait normal Psychiatric: normal affect, behavior normal, pleasant  Breast: nontender, no masses or lumps, no skin changes, no nipple discharge or inversion, no axillary lymphadenopathy Gyn: deferred Rectal: deferred    Assessment and Plan :    Encounter Diagnoses  Name Primary?  . Routine general medical examination at a health care facility Yes  . Obesity, unspecified   . Need for Tdap vaccination   . Need for pneumococcal vaccination   . Screen  for colon cancer   . Screening for breast cancer   . Screening for cervical cancer   . Tobacco use disorder   . Dental caries   . Skin anomaly     Physical exam - discussed healthy lifestyle, diet, exercise, preventative care, vaccinations, and addressed their concerns.  Handout given. Counseled on the Tdap (tetanus, diptheria, and acellular pertussis) vaccine.  Vaccine information sheet given. Tdap vaccine given after consent obtained. Counseled on the pneumococcal vaccine.  Vaccine information sheet given.  Pneumococcal vaccine given after consent obtained.  Specific recommendations today:  See eye doctor and dentist yearly  See dentist soon for dental caries  Return within 2 weeks for recheck on high blood pressure.  Check your pressures in the meantime.  Come in for yearly physical  We will check routine labs today  We will refer for first screening colonoscopy  We will refer for your first screening mammogram  We will refer you to dermatology for skin issue.  Return for pap smear when not menstruating  We recommend a yearly influenza vaccine  We updated your Tdap and pneumococcal vaccines today  Work on weight loss through healthy diet and exercise  Follow-up pending studies, referrals

## 2014-01-31 NOTE — Patient Instructions (Addendum)
Thank you for giving me the opportunity to serve you today.   Your diagnosis today includes: Encounter Diagnoses  Name Primary?  . Routine general medical examination at a health care facility Yes  . Obesity, unspecified   . Need for Tdap vaccination   . Need for pneumococcal vaccination   . Screen for colon cancer   . Screening for breast cancer   . Screening for cervical cancer   . Tobacco use disorder   . Dental caries   . Skin anomaly   . Elevated blood pressure reading without diagnosis of hypertension      Specific recommendations today include:  See eye doctor and dentist yearly  See dentist soon for dental caries  Return within 2 weeks for recheck on high blood pressure.  Check your pressures in the meantime.  Come in for yearly physical  We will check routine labs today  We will refer for first screening colonoscopy  We will refer for your first screening mammogram  We will refer you to dermatology for skin issue.  Return for pap smear  We recommend a yearly influenza vaccine  We updated your Tdap and pneumococcal vaccines today  Work on weight loss through healthy diet and exercise  Follow up: pending labs and referrals   I have included other useful information below for your review.  Hypertension As your heart beats, it forces blood through your arteries. This force is your blood pressure. If the pressure is too high, it is called hypertension (HTN) or high blood pressure. HTN is dangerous because you may have it and not know it. High blood pressure may mean that your heart has to work harder to pump blood. Your arteries may be narrow or stiff. The extra work puts you at risk for heart disease, stroke, and other problems.  Blood pressure consists of two numbers, a higher number over a lower, 110/72, for example. It is stated as "110 over 72." The ideal is below 120 for the top number (systolic) and under 80 for the bottom (diastolic). Write down your  blood pressure today. You should pay close attention to your blood pressure if you have certain conditions such as:  Heart failure.  Prior heart attack.  Diabetes  Chronic kidney disease.  Prior stroke.  Multiple risk factors for heart disease. To see if you have HTN, your blood pressure should be measured while you are seated with your arm held at the level of the heart. It should be measured at least twice. A one-time elevated blood pressure reading (especially in the Emergency Department) does not mean that you need treatment. There may be conditions in which the blood pressure is different between your right and left arms. It is important to see your caregiver soon for a recheck. Most people have essential hypertension which means that there is not a specific cause. This type of high blood pressure may be lowered by changing lifestyle factors such as:  Stress.  Smoking.  Lack of exercise.  Excessive weight.  Drug/tobacco/alcohol use.  Eating less salt. Most people do not have symptoms from high blood pressure until it has caused damage to the body. Effective treatment can often prevent, delay or reduce that damage. TREATMENT  When a cause has been identified, treatment for high blood pressure is directed at the cause. There are a large number of medications to treat HTN. These fall into several categories, and your caregiver will help you select the medicines that are best for you. Medications  may have side effects. You should review side effects with your caregiver. If your blood pressure stays high after you have made lifestyle changes or started on medicines,   Your medication(s) may need to be changed.  Other problems may need to be addressed.  Be certain you understand your prescriptions, and know how and when to take your medicine.  Be sure to follow up with your caregiver within the time frame advised (usually within two weeks) to have your blood pressure rechecked  and to review your medications.  If you are taking more than one medicine to lower your blood pressure, make sure you know how and at what times they should be taken. Taking two medicines at the same time can result in blood pressure that is too low. SEEK IMMEDIATE MEDICAL CARE IF:  You develop a severe headache, blurred or changing vision, or confusion.  You have unusual weakness or numbness, or a faint feeling.  You have severe chest or abdominal pain, vomiting, or breathing problems. MAKE SURE YOU:   Understand these instructions.  Will watch your condition.  Will get help right away if you are not doing well or get worse. Document Released: 11/23/2005 Document Revised: 02/15/2012 Document Reviewed: 07/13/2008 [facility] Patient Information 2014 [place], [place].

## 2014-02-02 NOTE — Addendum Note (Signed)
Addended by: Leretha DykesSCALES, Jamail Cullers L on: 02/02/2014 11:12 AM   Modules accepted: Orders

## 2014-02-13 ENCOUNTER — Other Ambulatory Visit: Payer: Self-pay | Admitting: Physician Assistant

## 2014-02-14 ENCOUNTER — Encounter: Payer: Self-pay | Admitting: Internal Medicine

## 2014-02-16 ENCOUNTER — Ambulatory Visit (INDEPENDENT_AMBULATORY_CARE_PROVIDER_SITE_OTHER): Payer: BC Managed Care – PPO | Admitting: Medical

## 2014-02-16 ENCOUNTER — Other Ambulatory Visit (HOSPITAL_COMMUNITY)
Admission: RE | Admit: 2014-02-16 | Discharge: 2014-02-16 | Disposition: A | Discharge status: Home / Self Care | Payer: BC Managed Care – PPO | Source: Ambulatory Visit | Attending: Medical | Admitting: Medical

## 2014-02-16 ENCOUNTER — Encounter: Payer: Self-pay | Admitting: Medical

## 2014-02-16 VITALS — BP 162/100 | HR 80 | Temp 97.9°F | Resp 16 | Wt 287.0 lb

## 2014-02-16 DIAGNOSIS — Z1151 Encounter for screening for human papillomavirus (HPV): Secondary | ICD-10-CM | POA: Insufficient documentation

## 2014-02-16 DIAGNOSIS — I10 Essential (primary) hypertension: Secondary | ICD-10-CM

## 2014-02-16 DIAGNOSIS — Z124 Encounter for screening for malignant neoplasm of cervix: Secondary | ICD-10-CM

## 2014-02-16 DIAGNOSIS — R232 Flushing: Secondary | ICD-10-CM

## 2014-02-16 DIAGNOSIS — N951 Menopausal and female climacteric states: Secondary | ICD-10-CM

## 2014-02-16 DIAGNOSIS — Z01419 Encounter for gynecological examination (general) (routine) without abnormal findings: Secondary | ICD-10-CM | POA: Insufficient documentation

## 2014-02-16 MED ORDER — CLONIDINE HCL 0.1 MG PO TABS: 0.1000 mg | ORAL_TABLET | Freq: Every day | ORAL | Status: DC

## 2014-02-16 MED ORDER — HYDROCHLOROTHIAZIDE 12.5 MG PO TABS: 12.5000 mg | ORAL_TABLET | Freq: Every day | ORAL | Status: DC

## 2014-02-16 MED ORDER — OLMESARTAN MEDOXOMIL 20 MG PO TABS: 20.0000 mg | ORAL_TABLET | Freq: Every day | ORAL | Status: DC

## 2014-02-16 NOTE — Patient Instructions (Signed)
Thank you for giving me the opportunity to serve you today.    Your diagnosis today includes: Encounter Diagnoses  Name Primary?  . Essential hypertension, benign Yes  . Screening for cervical cancer   . Perimenopausal   . Hot flashes      Specific recommendations today include:  Begin clonidine 0.1 mg tablet daily at bedtime for hot flashes. Do not stop this abruptly if  you need to come off the medication.  Begin 2 medications for high blood pressure:  Begin Benicar samples 20 mg daily in the morning  Begin hydrochlorothiazide 12.5 mg daily in the morning as well  Follow up: 1 month   I have included other useful information below for your review.  Hypertension As your heart beats, it forces blood through your arteries. This force is your blood pressure. If the pressure is too high, it is called hypertension (HTN) or high blood pressure. HTN is dangerous because you may have it and not know it. High blood pressure may mean that your heart has to work harder to pump blood. Your arteries may be narrow or stiff. The extra work puts you at risk for heart disease, stroke, and other problems.  Blood pressure consists of two numbers, a higher number over a lower, 110/72, for example. It is stated as "110 over 72." The ideal is below 120 for the top number (systolic) and under 80 for the bottom (diastolic). Write down your blood pressure today. You should pay close attention to your blood pressure if you have certain conditions such as:  Heart failure.  Prior heart attack.  Diabetes  Chronic kidney disease.  Prior stroke.  Multiple risk factors for heart disease. To see if you have HTN, your blood pressure should be measured while you are seated with your arm held at the level of the heart. It should be measured at least twice. A one-time elevated blood pressure reading (especially in the Emergency Department) does not mean that you need treatment. There may be conditions in  which the blood pressure is different between your right and left arms. It is important to see your caregiver soon for a recheck. Most people have essential hypertension which means that there is not a specific cause. This type of high blood pressure may be lowered by changing lifestyle factors such as:  Stress.  Smoking.  Lack of exercise.  Excessive weight.  Drug/tobacco/alcohol use.  Eating less salt. Most people do not have symptoms from high blood pressure until it has caused damage to the body. Effective treatment can often prevent, delay or reduce that damage. TREATMENT  When a cause has been identified, treatment for high blood pressure is directed at the cause. There are a large number of medications to treat HTN. These fall into several categories, and your caregiver will help you select the medicines that are best for you. Medications may have side effects. You should review side effects with your caregiver. If your blood pressure stays high after you have made lifestyle changes or started on medicines,   Your medication(s) may need to be changed.  Other problems may need to be addressed.  Be certain you understand your prescriptions, and know how and when to take your medicine.  Be sure to follow up with your caregiver within the time frame advised (usually within two weeks) to have your blood pressure rechecked and to review your medications.  If you are taking more than one medicine to lower your blood pressure, make sure  you know how and at what times they should be taken. Taking two medicines at the same time can result in blood pressure that is too low. SEEK IMMEDIATE MEDICAL CARE IF:  You develop a severe headache, blurred or changing vision, or confusion.  You have unusual weakness or numbness, or a faint feeling.  You have severe chest or abdominal pain, vomiting, or breathing problems. MAKE SURE YOU:   Understand these instructions.  Will watch your  condition.  Will get help right away if you are not doing well or get worse. Document Released: 11/23/2005 Document Revised: 02/15/2012 Document Reviewed: 07/13/2008 [facility] Patient Information 2014 [place], [place].

## 2014-02-16 NOTE — Progress Notes (Signed)
   Subjective:   [name] is a 53 y.o. female presenting on 02/16/2014 with Gynecologic Exam  She came in recently as a new patient for physical.  She is married, getting periods regularly still, although there little bit heavier in the first day or 2, last menstrual period last visit she has a history of tubal ligation. Here today for Pap smear as a followup from her physical  She brought her blood pressure readings from last visit where her blood pressure was elevated.  Recent systolic blood pressures range from 157-192, diastolic blood pressures 97-116.  She is concerned about menopause, still getting periods regularly, but has bad hot flashes.  No other complaint.  Review of Systems ROS as in subjective      Objective:     Filed Vitals:   02/16/14 1007  BP: 162/100  Pulse: 80  Temp: 97.9 F (36.6 C)  Resp: 16    General appearance: alert, no distress, WD/WN Heart: RRR, normal S1, S2, no murmurs Abdomen: +bs, soft, non tender, non distended, no masses, no hepatomegaly, no splenomegaly Pulses: 2+ symmetric, upper and lower extremities, normal cap refill Gyn: Normal external genitalia without lesions, right labia majora with 2 individual 2mm lumps/papules that pt attributes to boils that come and go, not new, vagina with normal mucosa, cervix somewhat difficluty to visualized given her body habitus, otherwise cervix without lesions, no cervical motion tenderness, no abnormal vaginal discharge.  Uterus and adnexa not enlarged, nontender, no masses.  Pap performed.  Exam chaperoned by nurse. Rectal: deferred to GI      Assessment: Encounter Diagnoses  Name Primary?  . Essential hypertension, benign Yes  . Screening for cervical cancer   . Perimenopausal   . Hot flashes      Plan: Hypertension-new diagnosis today. Discussed the diagnosis, complications, risks, discussed medication options. Begin Benicar samples 20 mg daily, begin hydrochlorothiazide 12.5 mg  daily. Discussed weight loss recommendations, salt decrease in diet, recheck in 1 month  Pap smear sent today, discussed cervical cancer screening  Hot flashes, perimenopausal-begin clonidine at bedtime for hot flashes  [name] was seen today for gynecologic exam.  Diagnoses and associated orders for this visit:  Essential hypertension, benign  Screening for cervical cancer - Cytology - PAP  Perimenopausal  Hot flashes  Other Orders - cloNIDine (CATAPRES) 0.1 MG tablet; Take 1 tablet (0.1 mg total) by mouth at bedtime. - hydrochlorothiazide (HYDRODIURIL) 12.5 MG tablet; Take 1 tablet (12.5 mg total) by mouth daily. - olmesartan (BENICAR) 20 MG tablet; Take 1 tablet (20 mg total) by mouth daily.      Return in about 1 month (around 03/19/2014).

## 2014-02-20 ENCOUNTER — Other Ambulatory Visit: Payer: Self-pay | Admitting: Medical

## 2014-02-20 ENCOUNTER — Telehealth: Payer: Self-pay

## 2014-02-20 MED ORDER — METRONIDAZOLE 500 MG PO TABS: ORAL_TABLET | ORAL | Status: DC

## 2014-02-20 NOTE — Telephone Encounter (Signed)
Message copied by Lilli LightLOMAX, WENDY G on Tue Feb 20, 2014 11:06 AM ------      Message from: Jac CanavanYSINGER, DAVID S      Created: Tue Feb 20, 2014  7:57 AM       Her Pap smear was negative for cancer cells, negative for HPV, however they did see Trichomonas infection. I will like her to take an antibiotic called metronidazole, 4 tablets once. She cannot drink alcohol the day she takes the medication.  I recommend we repeat a screen for this again in 2-3 weeks.  Her husband also need to be treated.            (Please verify his name and date of birth as I believe he is my patient as well - so I can send out the same medication for him) ------

## 2014-02-20 NOTE — Telephone Encounter (Signed)
Open chart in error-[facility]

## 2014-03-19 ENCOUNTER — Ambulatory Visit: Payer: BC Managed Care – PPO | Admitting: Medical

## 2014-03-29 ENCOUNTER — Telehealth: Payer: Self-pay

## 2014-03-29 NOTE — Telephone Encounter (Signed)
Pt with BMI >50.  Needs to be scheduled at hospital.  When you call her with procedure date and time please ask her to reschedule her previsit as well.  Thank you.

## 2014-04-03 ENCOUNTER — Other Ambulatory Visit: Payer: Self-pay

## 2014-04-03 DIAGNOSIS — Z1211 Encounter for screening for malignant neoplasm of colon: Secondary | ICD-10-CM

## 2014-04-03 NOTE — Telephone Encounter (Signed)
[facility]  Pre-visit is scheduled for 05/09/14 10:00

## 2014-04-13 ENCOUNTER — Encounter: Payer: Self-pay | Admitting: Internal Medicine

## 2014-05-09 ENCOUNTER — Telehealth: Payer: Self-pay

## 2014-05-09 NOTE — Telephone Encounter (Signed)
FYI Scheduled for [facility] on 6/16.  No show for PV today @ 10.

## 2014-05-09 NOTE — Telephone Encounter (Signed)
Left message for patient to call back  

## 2014-05-10 NOTE — Telephone Encounter (Signed)
Left message for patient to call back  

## 2014-05-15 NOTE — Telephone Encounter (Signed)
Left message for patient to call back  

## 2014-05-16 NOTE — Telephone Encounter (Signed)
I have left another message for the patient that if I don't hear from her by tomorrow pm I will need to cancel her procedure on 6/16 at [facility]

## 2014-05-18 NOTE — Telephone Encounter (Signed)
Patient was rescheduled with [facility] for July.

## 2014-06-19 ENCOUNTER — Encounter (HOSPITAL_COMMUNITY): Payer: Self-pay | Admitting: Pharmacy Technician

## 2014-06-27 ENCOUNTER — Ambulatory Visit (AMBULATORY_SURGERY_CENTER): Payer: Self-pay | Admitting: *Deleted

## 2014-06-27 VITALS — Ht 62.0 in | Wt 294.0 lb

## 2014-06-27 DIAGNOSIS — Z1211 Encounter for screening for malignant neoplasm of colon: Secondary | ICD-10-CM

## 2014-06-27 MED ORDER — NA SULFATE-K SULFATE-MG SULF 17.5-3.13-1.6 GM/177ML PO SOLN: 1.0000 | Freq: Once | ORAL | Status: DC

## 2014-06-27 NOTE — Progress Notes (Signed)
No allergies to eggs or soy. No problems with anesthesia.  Pt given Emmi instructions for colonoscopy  No oxygen use  No diet drug use  

## 2014-06-28 ENCOUNTER — Encounter (HOSPITAL_COMMUNITY): Payer: Self-pay | Admitting: *Deleted

## 2014-06-29 ENCOUNTER — Encounter: Payer: Self-pay | Admitting: Internal Medicine

## 2014-07-04 ENCOUNTER — Telehealth: Payer: Self-pay | Admitting: Internal Medicine

## 2014-07-04 DIAGNOSIS — Z1211 Encounter for screening for malignant neoplasm of colon: Secondary | ICD-10-CM

## 2014-07-04 MED ORDER — NA SULFATE-K SULFATE-MG SULF 17.5-3.13-1.6 GM/177ML PO SOLN: 1.0000 | Freq: Once | ORAL | Status: DC

## 2014-07-04 NOTE — Telephone Encounter (Signed)
Pt will come by [facility] 4th floor to pick up sample of Suprep

## 2014-07-04 NOTE — Telephone Encounter (Signed)
No answer. Will try later

## 2014-07-06 ENCOUNTER — Encounter (HOSPITAL_COMMUNITY): Payer: Self-pay | Admitting: Emergency Medicine

## 2014-07-06 ENCOUNTER — Encounter (HOSPITAL_COMMUNITY): Payer: Self-pay

## 2014-07-06 ENCOUNTER — Ambulatory Visit (HOSPITAL_COMMUNITY)
Admission: RE | Admit: 2014-07-06 | Discharge: 2014-07-06 | Payer: BC Managed Care – PPO | Source: Ambulatory Visit | Attending: Internal Medicine | Admitting: Internal Medicine

## 2014-07-06 ENCOUNTER — Ambulatory Visit (HOSPITAL_COMMUNITY): Payer: BC Managed Care – PPO | Admitting: Registered Nurse

## 2014-07-06 ENCOUNTER — Encounter (HOSPITAL_COMMUNITY): Payer: BC Managed Care – PPO | Admitting: Registered Nurse

## 2014-07-06 ENCOUNTER — Telehealth: Payer: Self-pay

## 2014-07-06 ENCOUNTER — Emergency Department (HOSPITAL_COMMUNITY)
Admission: EM | Admit: 2014-07-06 | Discharge: 2014-07-06 | Disposition: A | Discharge status: Home / Self Care | Payer: BC Managed Care – PPO | Attending: Emergency Medicine | Admitting: Emergency Medicine

## 2014-07-06 ENCOUNTER — Encounter (HOSPITAL_COMMUNITY)
Admission: RE | Disposition: A | Discharge status: Other Acute Inpatient Hospital | Payer: Self-pay | Source: Ambulatory Visit | Attending: Internal Medicine

## 2014-07-06 DIAGNOSIS — Z86718 Personal history of other venous thrombosis and embolism: Secondary | ICD-10-CM | POA: Insufficient documentation

## 2014-07-06 DIAGNOSIS — I10 Essential (primary) hypertension: Secondary | ICD-10-CM | POA: Insufficient documentation

## 2014-07-06 DIAGNOSIS — Z8619 Personal history of other infectious and parasitic diseases: Secondary | ICD-10-CM | POA: Insufficient documentation

## 2014-07-06 DIAGNOSIS — F17200 Nicotine dependence, unspecified, uncomplicated: Secondary | ICD-10-CM | POA: Insufficient documentation

## 2014-07-06 DIAGNOSIS — Z538 Procedure and treatment not carried out for other reasons: Secondary | ICD-10-CM | POA: Insufficient documentation

## 2014-07-06 DIAGNOSIS — Z872 Personal history of diseases of the skin and subcutaneous tissue: Secondary | ICD-10-CM | POA: Insufficient documentation

## 2014-07-06 DIAGNOSIS — Z1211 Encounter for screening for malignant neoplasm of colon: Secondary | ICD-10-CM | POA: Insufficient documentation

## 2014-07-06 DIAGNOSIS — E669 Obesity, unspecified: Secondary | ICD-10-CM | POA: Insufficient documentation

## 2014-07-06 LAB — BASIC METABOLIC PANEL
Anion gap: 13 (ref 5–15)
BUN: 6 mg/dL (ref 6–23)
CO2: 24 meq/L (ref 19–32)
Calcium: 9.9 mg/dL (ref 8.4–10.5)
Chloride: 103 mEq/L (ref 96–112)
Creatinine, Ser: 0.86 mg/dL (ref 0.50–1.10)
GFR calc Af Amer: 89 mL/min — ABNORMAL LOW (ref >90)
GFR calc non Af Amer: 77 mL/min — ABNORMAL LOW (ref >90)
GLUCOSE: 89 mg/dL (ref 70–99)
POTASSIUM: 4 meq/L (ref 3.7–5.3)
SODIUM: 140 meq/L (ref 137–147)

## 2014-07-06 SURGERY — COLONOSCOPY WITH PROPOFOL
Anesthesia: Monitor Anesthesia Care

## 2014-07-06 MED ORDER — PROPOFOL 10 MG/ML IV BOLUS
INTRAVENOUS | Status: AC
Start: 2014-07-06 — End: 2014-07-06
  Filled 2014-07-06: qty 20

## 2014-07-06 MED ORDER — PROPOFOL 10 MG/ML IV BOLUS
INTRAVENOUS | Status: AC
  Filled 2014-07-06: qty 20

## 2014-07-06 MED ORDER — LACTATED RINGERS IV SOLN
INTRAVENOUS | Status: DC | PRN
  Administered 2014-07-06: 10:00:00 via INTRAVENOUS

## 2014-07-06 MED ORDER — MORPHINE SULFATE 4 MG/ML IJ SOLN: 4.0000 mg | Freq: Once | INTRAMUSCULAR | Status: DC

## 2014-07-06 MED ORDER — LIDOCAINE HCL (CARDIAC) 20 MG/ML IV SOLN
INTRAVENOUS | Status: AC
  Filled 2014-07-06: qty 5

## 2014-07-06 MED ORDER — CLONIDINE HCL 0.2 MG PO TABS: 0.2000 mg | ORAL_TABLET | Freq: Every day | ORAL | Status: DC

## 2014-07-06 MED ORDER — MIDAZOLAM HCL 2 MG/2ML IJ SOLN
INTRAMUSCULAR | Status: AC
  Filled 2014-07-06: qty 2

## 2014-07-06 MED ORDER — IRBESARTAN 75 MG PO TABS
75.0000 mg | ORAL_TABLET | Freq: Once | ORAL | Status: AC
  Administered 2014-07-06: 75 mg via ORAL
  Filled 2014-07-06: qty 1

## 2014-07-06 MED ORDER — CLONIDINE HCL 0.1 MG PO TABS
0.2000 mg | ORAL_TABLET | Freq: Once | ORAL | Status: AC
  Administered 2014-07-06: 0.2 mg via ORAL
  Filled 2014-07-06: qty 2

## 2014-07-06 MED ORDER — OLMESARTAN 10 MG HALF TABLET: 10.0000 mg | ORAL_TABLET | Freq: Every day | ORAL | Status: DC

## 2014-07-06 SURGICAL SUPPLY — 21 items

## 2014-07-06 NOTE — ED Provider Notes (Signed)
CSN: [id number]     Arrival date & time 07/06/14  1054 History   First MD Initiated Contact with Patient 07/06/14 1132     Chief Complaint  Patient presents with  . Hypertension     (Consider location/radiation/quality/duration/timing/severity/associated sxs/prior Treatment) HPI 53 year old female presents from the endoscopy suite with elevated blood pressure. On multiple checks her blood pressure was at least 902/111 systolic. The patient was and has been asymptomatic. This is a regular screening colonoscopy. The patient denies headaches, blurry vision, chest pain, shortness of breath, or other concerning symptoms. She used to be on Benicar (unknown dose, was given samples by PCP) and 0.2 mg clonidine. She's not been on it for 2 months due to multiple issues at home, including medical problems with her husband.  Past Medical History  Diagnosis Date  . Obesity   . Staphylococcal infection of skin 2006    1 month hospitalization, s/p C section  . Left leg DVT 2006    s/p C-section, 1 month hospitaliation for staph infection and period of immobility  . Hypertension    Past Surgical History  Procedure Laterality Date  . Abdominal exploration surgery  2006    staph infection s/p C-section  . Tubal ligation  2006  . Cesarean section      staph infection s/p childbirth by C-section, then Exploratory abdominal surgery done.   Family History  Problem Relation Age of Onset  . Hypertension Mother   . Diabetes Mother   . Kidney disease Father     transplant, and subsequent failure   . Pneumonia Father     died of pneumonia  . Hypertension Father   . Cancer Brother     stomach  . Heart disease Neg Hx   . Stroke Neg Hx   . Colon cancer Neg Hx   . Alcohol abuse Brother   . Ulcers Brother     bleeding ulcer related to alcohol abuse   History  Substance Use Topics  . Smoking status: Current Every Day Smoker -- 0.50 packs/day for 20 years    Types: Cigarettes  . Smokeless tobacco:  Never Used  . Alcohol Use: No   OB History   Grav Para Term Preterm Abortions TAB SAB Ect Mult Living                 Review of Systems  Constitutional: Negative for fever.  Eyes: Negative for visual disturbance.  Respiratory: Negative for shortness of breath.   Cardiovascular: Negative for chest pain.  Neurological: Negative for weakness and headaches.  All other systems reviewed and are negative.     Allergies  Review of patient's allergies indicates no known allergies.  Home Medications   Prior to Admission medications   Medication Sig Start Date End Date Taking? Authorizing Provider  ibuprofen (ADVIL,MOTRIN) 200 MG tablet Take 600 mg by mouth every 6 (six) hours as needed (Pain).    Historical Provider, MD  Na Sulfate-K Sulfate-Mg Sulf (SUPREP BOWEL PREP) SOLN Take 1 kit by mouth once. suprep as directed.  No substitutions 07/04/14   Gatha Mayer, MD   BP 175/82  Pulse 78  Temp(Src) 98.3 F (36.8 C) (Oral)  Resp 16  SpO2 97%  LMP 06/13/2014 Physical Exam  Nursing note and vitals reviewed. Constitutional: She is oriented to person, place, and time. She appears well-developed and well-nourished.  obese  HENT:  Head: Normocephalic and atraumatic.  Right Ear: External ear normal.  Left Ear: External ear normal.  Nose:  Nose normal.  Eyes: EOM are normal. Pupils are equal, round, and reactive to light. Right eye exhibits no discharge. Left eye exhibits no discharge.  Cardiovascular: Normal rate, regular rhythm and normal heart sounds.   Pulmonary/Chest: Effort normal and breath sounds normal.  Abdominal: She exhibits no distension.  Neurological: She is alert and oriented to person, place, and time.  CN 2-12 grossly intact. 5/5 strength in all 4 extremities  Skin: Skin is warm and dry.    ED Course  Procedures (including critical care time) Labs Review Labs Reviewed  BASIC METABOLIC PANEL - Abnormal; Notable for the following:    GFR calc non Af Amer 77 (*)     GFR calc Af Amer 89 (*)    All other components within normal limits    Imaging Review No results found.   EKG Interpretation None      MDM   Final diagnoses:  Essential hypertension    Patient with asymptomatic hypertension. Has benign exam. At this point her blood pressure is around 787-183 systolic. Given clonidine and an ARB here. She was previously on clonidine and Benicar and does will restart this at the doses she thinks she was on. Discussed warning signs of pathology from hypertension, such as MI, CVA, etc. Otherwise will followup with PCP.    Ephraim Hamburger, MD 07/06/14 1300

## 2014-07-06 NOTE — Progress Notes (Signed)
Pt. transferred to Emergency Dept. For treatment of hypertensive crisis per Dr Leone PayorGessner. B/P 247/107

## 2014-07-06 NOTE — ED Notes (Signed)
Per endo RN, here for colonoscopy work up and BP 247/107-has not been taking BP meds for months-asymptomatic

## 2014-07-06 NOTE — Anesthesia Preprocedure Evaluation (Addendum)
Anesthesia Evaluation  Patient identified by MRN, date of birth, ID band Patient awake, Patient confused and Patient unresponsive    Reviewed: Allergy & Precautions, H&P , NPO status , Patient's Chart, lab work & pertinent test results  History of Anesthesia Complications (+) MALIGNANT HYPERTHERMIANegative for: history of anesthetic complications  Airway       Dental   Pulmonary Current Smoker,          Cardiovascular Exercise Tolerance: Poor hypertension,     Neuro/Psych negative psych ROS   GI/Hepatic negative GI ROS, Neg liver ROS,   Endo/Other  Morbid obesity  Renal/GU negative Renal ROS  negative genitourinary   Musculoskeletal negative musculoskeletal ROS (+)   Abdominal   Peds  Hematology negative hematology ROS (+)   Anesthesia Other Findings   Reproductive/Obstetrics negative OB ROS                          Anesthesia Physical Anesthesia Plan  ASA: III  Anesthesia Plan: MAC   Post-op Pain Management:    Induction: Intravenous  Airway Management Planned: Simple Face Mask  Additional Equipment:   Intra-op Plan:   Post-operative Plan:   Informed Consent: I have reviewed the patients History and Physical, chart, labs and discussed the procedure including the risks, benefits and alternatives for the proposed anesthesia with the patient or authorized representative who has indicated his/her understanding and acceptance.   Dental advisory given  Plan Discussed with:   Anesthesia Plan Comments: (Pt with untreated HTN. Pt states she stopped taking her Blood pressure medicine.)        Anesthesia Quick Evaluation

## 2014-07-06 NOTE — ED Notes (Signed)
Initial contact-A&Ox4. Moving all extremities. No c/o dizziness, SOB. Denies pain. Able to eat per MD. In NAD. MD Criss AlvineGoldston at bedside.

## 2014-07-06 NOTE — H&P (Addendum)
[name] History and Physical   Primary Care Physician:  Crisoforo Oxford, PA-C   Reason for Procedure:   Colon cancer screening  Plan:    Colonoscopy - The risks and benefits as well as alternatives of endoscopic procedure(s) have     been discussed and reviewed. All questions answered. The patient agrees to proceed.   Case cancelled due to elevated BP - nosxs - will send to ED hypertensive urgency  HPI: [name] is a 53 y.o. female here for colon cancer screening with colonoscopy   Past Medical History  Diagnosis Date  . Obesity   . Staphylococcal infection of skin 2006    1 month hospitalization, s/p C section  . Left leg DVT 2006    s/p C-section, 1 month hospitaliation for staph infection and period of immobility  . Hypertension     Past Surgical History  Procedure Laterality Date  . Abdominal exploration surgery  2006    staph infection s/p C-section  . Tubal ligation  2006  . Cesarean section      staph infection s/p childbirth by C-section, then Exploratory abdominal surgery done.    Prior to Admission medications   Medication Sig Start Date End Date Taking? Authorizing Provider  ibuprofen (ADVIL,MOTRIN) 200 MG tablet Take 600 mg by mouth every 6 (six) hours as needed (Pain).    Historical Provider, MD  Na Sulfate-K Sulfate-Mg Sulf (SUPREP BOWEL PREP) SOLN Take 1 kit by mouth once. suprep as directed.  No substitutions 07/04/14   Gatha Mayer, MD    No current facility-administered medications for this encounter.    Allergies as of 04/03/2014  . (No Known Allergies)    Family History  Problem Relation Age of Onset  . Hypertension Mother   . Diabetes Mother   . Kidney disease Father     transplant, and subsequent failure   . Pneumonia Father     died of pneumonia  . Hypertension Father   . Cancer Brother     stomach  . Heart disease Neg Hx   . Stroke Neg Hx   . Colon cancer Neg Hx   . Alcohol abuse Brother   . Ulcers  Brother     bleeding ulcer related to alcohol abuse    History   Social History  . Marital Status: Married    Spouse Name: N/A    Number of Children: N/A  . Years of Education: N/A   Occupational History  . Not on file.   Social History Main Topics  . Smoking status: Current Every Day Smoker -- 0.50 packs/day for 20 years    Types: Cigarettes  . Smokeless tobacco: Never Used  . Alcohol Use: No  . Drug Use: No  . Sexual Activity: Yes   Other Topics Concern  . Not on file   Social History Narrative   Married, has 3 children, works for [employer], no exercise    Review of Systems: All other review of systems negative except as mentioned in the HPI.  Physical Exam: Vital signs in last 24 hours:  See flow sheets   LMP 06/13/2014      General:   Alert,  Well-developed, well-nourished, pleasant and cooperative in NAD Lungs:  Clear throughout to auscultation.   Heart:  Regular rate and rhythm; no murmurs, clicks, rubs,  or gallops. Abdomen:  Soft, nontender and nondistended. Normal bowel sounds.   Neuro/Psych:  Alert and cooperative. Normal mood and affect. A  and O x 3   '@Carl'  Simonne Maffucci, MD, [facility] [phone] (pager) 07/06/2014 9:30 AM@

## 2014-07-06 NOTE — Progress Notes (Signed)
B/P taken on both arms and forearm

## 2014-07-06 NOTE — Telephone Encounter (Signed)
Message copied by Annett FabianJONES, Kaydince Towles L on Fri Jul 06, 2014 10:51 AM ------      Message from: Iva BoopGESSNER, CARL E      Created: Fri Jul 06, 2014 10:31 AM      Regarding: colon cancelled       BO way too high - stopped her meds            Cancelling and sending to ED            Place a recall for October            thanks ------

## 2014-07-06 NOTE — Discharge Instructions (Signed)

## 2014-07-06 NOTE — ED Notes (Signed)
Pt arrived for routine colonoscopy in endo. Endo staff found pt's blood pressure to be elevated, and was sent here for evaluation. Pt denies any symptoms, no chest pain/headache/blurred vision. Pt states she has not taken blood pressure medications for past 2 months. BP 175/82 on arrival to ED.

## 2014-07-06 NOTE — Telephone Encounter (Signed)
Recall placed

## 2014-07-09 ENCOUNTER — Telehealth: Payer: Self-pay | Admitting: Medical

## 2014-07-09 NOTE — Telephone Encounter (Signed)
Pt seen in ER on Friday she was told to take Clonidine and Benicar for BP but when pt advised that did not have script for Benicar & was only given samples, she was told to get script from Primary Care. Does Vincenza HewsShane want to give her the Benicar script today to regulate BP or wait until her appoint tomorrow? [facility] to [facility] [address]

## 2014-07-10 ENCOUNTER — Inpatient Hospital Stay: Payer: BC Managed Care – PPO | Admitting: Medical

## 2014-07-12 ENCOUNTER — Other Ambulatory Visit: Payer: Self-pay

## 2014-07-12 MED ORDER — OLMESARTAN 10 MG HALF TABLET: 10.0000 mg | ORAL_TABLET | Freq: Every day | ORAL | Status: DC

## 2014-07-12 NOTE — Telephone Encounter (Signed)
Send whatever she needs so she doesn't run out, but she was on the schedule.   Needs hospital OV follow up.

## 2014-07-12 NOTE — Telephone Encounter (Signed)
I HAVE SENT PT MED IN [facility] 10 MG SHE SAID SHE HAS APPOINTMENT ON 8/12

## 2014-07-13 ENCOUNTER — Other Ambulatory Visit: Payer: Self-pay | Admitting: Medical

## 2014-07-13 ENCOUNTER — Telehealth: Payer: Self-pay | Admitting: Medical

## 2014-07-13 MED ORDER — LOSARTAN POTASSIUM 50 MG PO TABS: 50.0000 mg | ORAL_TABLET | Freq: Every day | ORAL | Status: DC

## 2014-07-13 NOTE — Telephone Encounter (Signed)
pls call out Losartan 50mg  once daily #30, 2 refill instead of Benicar.   Needs f/u OV too

## 2014-07-13 NOTE — Telephone Encounter (Signed)
Benicar 5 mg needs prior authorization from patients insurance. We do not have samples, patient wants to know if she needs to double up on her other bp medication. She states her bp was really elevated when she sent to have colonoscopy done  Please call   PA received and given to Vernona RiegerLaura

## 2014-07-13 NOTE — Telephone Encounter (Signed)
Ben[facility] Rx [facility] [address] per [employer]PL GroupShane Tysinger

## 2014-07-13 NOTE — Telephone Encounter (Signed)
[facility], left message for pt

## 2014-07-18 ENCOUNTER — Inpatient Hospital Stay: Payer: BC Managed Care – PPO | Admitting: Medical

## 2014-07-18 NOTE — Telephone Encounter (Signed)
P.A. [place] FootmanBenicar was denied, pt must have tried Atacand, Avapro, Cozaar

## 2014-07-24 ENCOUNTER — Ambulatory Visit (INDEPENDENT_AMBULATORY_CARE_PROVIDER_SITE_OTHER): Payer: BC Managed Care – PPO | Admitting: Medical

## 2014-07-24 ENCOUNTER — Encounter: Payer: Self-pay | Admitting: Medical

## 2014-07-24 VITALS — BP 182/100 | HR 80 | Temp 98.1°F | Resp 16 | Wt 291.0 lb

## 2014-07-24 DIAGNOSIS — I10 Essential (primary) hypertension: Secondary | ICD-10-CM

## 2014-07-24 DIAGNOSIS — Z23 Encounter for immunization: Secondary | ICD-10-CM

## 2014-07-24 MED ORDER — LOSARTAN POTASSIUM-HCTZ 100-12.5 MG PO TABS: 1.0000 | ORAL_TABLET | Freq: Every day | ORAL | Status: DC

## 2014-07-24 MED ORDER — CLONIDINE HCL 0.2 MG PO TABS: 0.2000 mg | ORAL_TABLET | Freq: Every day | ORAL | Status: DC

## 2014-07-24 NOTE — Patient Instructions (Signed)
Thank you for giving me the opportunity to serve you today.    Your diagnosis today includes: Encounter Diagnoses  Name Primary?  . Essential hypertension, benign Yes  . Need for prophylactic vaccination and inoculation against influenza      Specific recommendations today include:  We updated your influenza vaccine today  Continue clonidine 0.2 mg daily but take this at bedtime  Begin losartan/HCTZ 100/12.5 mg daily in the morning  Stop losartan 50  Stop or do not restart Benicar  Increase your water intake, stop drinking soda  Don't add salt to your food  Work on losing weight through healthy diet and exercise  Check your blood pressures 2-3 times per week and write these down  Return to see the nurse in one month to recheck your blood pressure  Return 35mo nurse visit.    I have included other useful information below for your review.  Hypertension Hypertension, commonly called high blood pressure, is when the force of blood pumping through your arteries is too strong. Your arteries are the blood vessels that carry blood from your heart throughout your body. A blood pressure reading consists of a higher number over a lower number, such as 110/72. The higher number (systolic) is the pressure inside your arteries when your heart pumps. The lower number (diastolic) is the pressure inside your arteries when your heart relaxes. Ideally you want your blood pressure below 120/80. Hypertension forces your heart to work harder to pump blood. Your arteries may become narrow or stiff. Having hypertension puts you at risk for heart disease, stroke, and other problems.  RISK FACTORS Some risk factors for high blood pressure are controllable. Others are not.  Risk factors you cannot control include:   Race. You may be at higher risk if you are African American.  Age. Risk increases with age.  Gender. Men are at higher risk than women before age 44 years. After age 46, women  are at higher risk than men. Risk factors you can control include:  Not getting enough exercise or physical activity.  Being overweight.  Getting too much fat, sugar, calories, or salt in your diet.  Drinking too much alcohol. SIGNS AND SYMPTOMS Hypertension does not usually cause signs or symptoms. Extremely high blood pressure (hypertensive crisis) may cause headache, anxiety, shortness of breath, and nosebleed. DIAGNOSIS  To check if you have hypertension, your health care provider will measure your blood pressure while you are seated, with your arm held at the level of your heart. It should be measured at least twice using the same arm. Certain conditions can cause a difference in blood pressure between your right and left arms. A blood pressure reading that is higher than normal on one occasion does not mean that you need treatment. If one blood pressure reading is high, ask your health care provider about having it checked again. TREATMENT  Treating high blood pressure includes making lifestyle changes and possibly taking medicine. Living a healthy lifestyle can help lower high blood pressure. You may need to change some of your habits. Lifestyle changes may include:  Following the DASH diet. This diet is high in fruits, vegetables, and whole grains. It is low in salt, red meat, and added sugars.  Getting at least 2 hours of brisk physical activity every week.  Losing weight if necessary.  Not smoking.  Limiting alcoholic beverages.  Learning ways to reduce stress. If lifestyle changes are not enough to get your blood pressure under control, your health  care provider may prescribe medicine. You may need to take more than one. Work closely with your health care provider to understand the risks and benefits. HOME CARE INSTRUCTIONS  Have your blood pressure rechecked as directed by your health care provider.   Take medicines only as directed by your health care provider.  Follow the directions carefully. Blood pressure medicines must be taken as prescribed. The medicine does not work as well when you skip doses. Skipping doses also puts you at risk for problems.   Do not smoke.   Monitor your blood pressure at home as directed by your health care provider. SEEK MEDICAL CARE IF:   You think you are having a reaction to medicines taken.  You have recurrent headaches or feel dizzy.  You have swelling in your ankles.  You have trouble with your vision. SEEK IMMEDIATE MEDICAL CARE IF:  You develop a severe headache or confusion.  You have unusual weakness, numbness, or feel faint.  You have severe chest or abdominal pain.  You vomit repeatedly.  You have trouble breathing. MAKE SURE YOU:   Understand these instructions.  Will watch your condition.  Will get help right away if you are not doing well or get worse. Document Released: 11/23/2005 Document Revised: 04/09/2014 Document Reviewed: 09/15/2013 [facility] Patient Information 2015 [place]. This information is not intended to replace advice given to you by your health care provider. Make sure you discuss any questions you have with your health care provider.

## 2014-07-24 NOTE — Addendum Note (Signed)
Addended by: Lilli LightLOMAX, Kaydense Rizo G on: 07/24/2014 11:02 AM   Modules accepted: Orders

## 2014-07-24 NOTE — Progress Notes (Signed)
   Subjective:   [name] is a 53 y.o. female presenting on 07/24/2014 with ER follow up  Here today for emergency department for followup. She went for routine colonoscopy but her blood pressure was really high, she been out of her medications. She was sent to the emergency department for uncontrolled blood pressure although she was asymptomatic.  We started her on blood pressure medication back in February, and she was given samples and had followup planned that she never came back for followup.  She is currently taking clonidine 0.2 mg daily in the morning which is making her sleepy as well as losartan 50 started by the emergency department.  Otherwise she feels fine.  She does realize the need to cut out soda she does add salt to food, she exercises a few days per week.  No other aggravating or relieving factors.  No other complaint.  Review of Systems ROS as in subjective      Objective:    Filed Vitals:   07/24/14 0833  BP: 182/100  Pulse: 80  Temp: 98.1 F (36.7 C)  Resp: 16    General appearance: alert, no distress, WD/WN Neck: supple, no lymphadenopathy, no thyromegaly, no masses, no bruits Heart: RRR, normal S1, S2, no murmurs Lungs: CTA bilaterally, no wheezes, rhonchi, or rales Pulses: 2+ symmetric, upper and lower extremities, normal cap refill Ext: no edema Neuro - nonfocal exam     Assessment: Encounter Diagnoses  Name Primary?  . Essential hypertension, benign Yes  . Need for prophylactic vaccination and inoculation against influenza      Plan: HTN - discussed the diagnosis of hypertension, risk and complications, need for compliance, medications changes as below  Specific recommendations today include:  We updated your influenza vaccine today  Continue clonidine 0.2 mg daily but take this at bedtime  Begin losartan/HCTZ 100/12.5 mg daily in the morning  Stop losartan 50  Stop or do not restart Benicar  Increase your water intake, stop  drinking soda  Don't add salt to your food  Work on losing weight through healthy diet and exercise  Check your blood pressures 2-3 times per week and write these down  Return to see the nurse in one month to recheck your blood pressure  Counseled on the influenza virus vaccine.  Vaccine information sheet given.  Influenza vaccine given after consent obtained.  [name] was seen today for er follow up.  Diagnoses and associated orders for this visit:  Essential hypertension, benign  Need for prophylactic vaccination and inoculation against influenza  Other Orders - losartan-hydrochlorothiazide (HYZAAR) 100-12.5 MG per tablet; Take 1 tablet by mouth daily. - cloNIDine (CATAPRES) 0.2 MG tablet; Take 1 tablet (0.2 mg total) by mouth daily.    Return 34mWallingford Endoscopy Center LLOrleneChales MarylandA0MKentucPrici4128moThe [place] CliniOrleneChales [place]A0MKentucPrici4123moMidtown Oaks Post-AcutOrleneChales [place]A0MKentucPrici4151mo[name]

## 2014-08-27 ENCOUNTER — Other Ambulatory Visit: Payer: BC Managed Care – PPO

## 2014-11-20 ENCOUNTER — Encounter: Payer: Self-pay | Admitting: Internal Medicine

## 2014-12-20 ENCOUNTER — Encounter (HOSPITAL_COMMUNITY): Payer: Self-pay | Admitting: Internal Medicine

## 2015-02-07 ENCOUNTER — Encounter: Payer: Self-pay | Admitting: Internal Medicine

## 2015-03-09 ENCOUNTER — Other Ambulatory Visit: Payer: Self-pay | Admitting: Medical

## 2015-03-11 NOTE — Telephone Encounter (Signed)
NEEDS AN APPOINTMENT BEFORE HER MEDICATION RUNS OUT

## 2015-04-17 ENCOUNTER — Other Ambulatory Visit: Payer: Self-pay | Admitting: Medical

## 2015-04-17 NOTE — Telephone Encounter (Signed)
PATIENT NEEDS A OFFICE VISIT TO CHECK BLOOD PRESSURE

## 2015-04-29 ENCOUNTER — Other Ambulatory Visit: Payer: Self-pay | Admitting: Medical

## 2015-05-01 NOTE — Telephone Encounter (Signed)
PATIENT NEEDS AN APPOINTMENT TO FOLLOW UP ON BLOOD PRESSURE, THANKS

## 2015-07-23 ENCOUNTER — Other Ambulatory Visit: Payer: Self-pay | Admitting: Medical

## 2015-08-03 ENCOUNTER — Emergency Department (HOSPITAL_COMMUNITY)
Admission: EM | Admit: 2015-08-03 | Discharge: 2015-08-03 | Disposition: A | Discharge status: Home / Self Care | Payer: BLUE CROSS/BLUE SHIELD | Attending: Emergency Medicine | Admitting: Emergency Medicine

## 2015-08-03 ENCOUNTER — Encounter (HOSPITAL_COMMUNITY): Payer: Self-pay | Admitting: Emergency Medicine

## 2015-08-03 DIAGNOSIS — Z86718 Personal history of other venous thrombosis and embolism: Secondary | ICD-10-CM | POA: Insufficient documentation

## 2015-08-03 DIAGNOSIS — H578 Other specified disorders of eye and adnexa: Secondary | ICD-10-CM | POA: Diagnosis present

## 2015-08-03 DIAGNOSIS — Z8619 Personal history of other infectious and parasitic diseases: Secondary | ICD-10-CM | POA: Diagnosis not present

## 2015-08-03 DIAGNOSIS — I10 Essential (primary) hypertension: Secondary | ICD-10-CM | POA: Insufficient documentation

## 2015-08-03 DIAGNOSIS — E669 Obesity, unspecified: Secondary | ICD-10-CM | POA: Insufficient documentation

## 2015-08-03 DIAGNOSIS — Z79899 Other long term (current) drug therapy: Secondary | ICD-10-CM | POA: Insufficient documentation

## 2015-08-03 DIAGNOSIS — Z720 Tobacco use: Secondary | ICD-10-CM | POA: Diagnosis not present

## 2015-08-03 DIAGNOSIS — H1131 Conjunctival hemorrhage, right eye: Secondary | ICD-10-CM | POA: Insufficient documentation

## 2015-08-03 MED ORDER — FLUORESCEIN SODIUM 1 MG OP STRP
1.0000 | ORAL_STRIP | Freq: Once | OPHTHALMIC | Status: AC
  Administered 2015-08-03: 1 via OPHTHALMIC
  Filled 2015-08-03: qty 1

## 2015-08-03 MED ORDER — TETRACAINE HCL 0.5 % OP SOLN
2.0000 [drp] | Freq: Once | OPHTHALMIC | Status: AC
  Administered 2015-08-03: 2 [drp] via OPHTHALMIC
  Filled 2015-08-03: qty 2

## 2015-08-03 NOTE — Discharge Instructions (Signed)
Read the information below.  You may return to the Emergency Department at any time for worsening condition or any new symptoms that concern you.   If you develop worsening pain in your eye, change in your vision, swelling around your eye, difficulty moving your eye, or fevers greater than 100.4, see your eye doctor or return to the Emergency Department immediately for a recheck.     Subconjunctival Hemorrhage A subconjunctival hemorrhage is a bright red patch covering a portion of the white of the eye. The white part of the eye is called the sclera, and it is covered by a thin membrane called the conjunctiva. This membrane is clear, except for tiny blood vessels that you can see with the naked eye. When your eye is irritated or inflamed and becomes red, it is because the vessels in the conjunctiva are swollen. Sometimes, a blood vessel in the conjunctiva can break and bleed. When this occurs, the blood builds up between the conjunctiva and the sclera, and spreads out to create a red area. The red spot may be very small at first. It may then spread to cover a larger part of the surface of the eye, or even all of the visible white part of the eye. In almost all cases, the blood will go away and the eye will become white again. Before completely dissolving, however, the red area may spread. It may also become brownish-yellow in color before going away. If a lot of blood collects under the conjunctiva, it may look like a bulge on the surface of the eye. This looks scary, but it will also eventually flatten out and go away. Subconjunctival hemorrhages do not cause pain, but if swollen, may cause a feeling of irritation. There is no effect on vision.  CAUSES   The most common cause is mild trauma (rubbing the eye, irritation).  Subconjunctival hemorrhages can happen because of coughing or straining (lifting heavy objects), vomiting, or sneezing.  In some cases, your doctor may want to check your blood  pressure. High blood pressure can also cause a subconjunctival hemorrhage.  Severe trauma or blunt injuries.  Diseases that affect blood clotting (hemophilia, leukemia).  Abnormalities of blood vessels behind the eye (carotid cavernous sinus fistula).  Tumors behind the eye.  Certain drugs (aspirin, Coumadin, heparin).  Recent eye surgery. HOME CARE INSTRUCTIONS   Do not worry about the appearance of your eye. You may continue your usual activities.  Often, follow-up is not necessary. SEEK MEDICAL CARE IF:   Your eye becomes painful.  The bleeding does not disappear within 3 weeks.  Bleeding occurs elsewhere, for example, under the skin, in the mouth, or in the other eye.  You have recurring subconjunctival hemorrhages. SEEK IMMEDIATE MEDICAL CARE IF:   Your vision changes or you have difficulty seeing.  You develop a severe headache, persistent vomiting, confusion, or abnormal drowsiness (lethargy).  Your eye seems to bulge or protrude from the eye socket.  You notice the sudden appearance of bruises or have spontaneous bleeding elsewhere on your body. Document Released: 11/23/2005 Document Revised: 04/09/2014 Document Reviewed: 10/21/2009 [facility] Patient Information 2015 [place], [place]. This information is not intended to replace advice given to you by your health care provider. Make sure you discuss any questions you have with your health care provider.

## 2015-08-03 NOTE — ED Notes (Signed)
Pt. Stated, I had a little redness on Thursday and its just getting worse.

## 2015-08-03 NOTE — ED Provider Notes (Signed)
CSN: [id number]     Arrival date & time 08/03/15  0941 History  This chart was scribed for non-physician practitioner, Clayton Bibles, PA-C working with Gareth Morgan, MD by Evelene Croon, ED Scribe. This patient was seen in room TR04C/TR04C and the patient's care was started at 10:08 AM.    Chief Complaint  Patient presents with  . Eye Problem    The history is provided by the patient. No language interpreter was used.     HPI Comments:  [name] is a 54 y.o. female who presents to the Emergency Department complaining of moderate redness to her right eye that worsened this am. She notes the redness started 3 days ago as a small dot and has progressively worsened since. She denies eye pain/burning; states she occasionally feels like there is something "gritty" in her eye.  She denies watering/ discharge from the eye, congestion, rhinorrhea, cough, vomiting, fall, injury, known FB to the eye. Pt also denies use of contacts. No alleviating factors noted.  No chemical splash, no sick contacts, no wood/metal hobbies.  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  She is supposed to wear glasses but doesn't.  Does not wear contacts.  Has HTN but takes medication regularly.  No recent hx vomiting, coughing, sneezing that could have caused sudden pressure change in eye.    Past Medical History  Diagnosis Date  . Obesity   . Staphylococcal infection of skin 2006    1 month hospitalization, s/p C section  . Left leg DVT 2006    s/p C-section, 1 month hospitaliation for staph infection and period of immobility  . Hypertension    Past Surgical History  Procedure Laterality Date  . Abdominal exploration surgery  2006    staph infection s/p C-section  . Tubal ligation  2006  . Cesarean section      staph infection s/p childbirth by C-section, then Exploratory abdominal surgery done.   Family History  Problem Relation Age of Onset  . Hypertension Mother   . Diabetes Mother   . Kidney  disease Father     transplant, and subsequent failure   . Pneumonia Father     died of pneumonia  . Hypertension Father   . Cancer Brother     stomach  . Heart disease Neg Hx   . Stroke Neg Hx   . Colon cancer Neg Hx   . Alcohol abuse Brother   . Ulcers Brother     bleeding ulcer related to alcohol abuse   Social History  Substance Use Topics  . Smoking status: Current Every Day Smoker -- 0.50 packs/day for 20 years    Types: Cigarettes  . Smokeless tobacco: Never Used  . Alcohol Use: No   OB History    No data available     Review of Systems  Constitutional: Negative for fever and chills.  HENT: Negative for congestion, dental problem, ear discharge, ear pain, facial swelling, rhinorrhea, sneezing, sore throat and trouble swallowing.   Eyes: Positive for redness. Negative for pain and discharge.  Respiratory: Negative for cough and shortness of breath.   Cardiovascular: Negative for chest pain.  Gastrointestinal: Negative for vomiting.  Musculoskeletal: Negative for myalgias and neck stiffness.  Skin: Negative for wound.  Allergic/Immunologic: Negative for immunocompromised state.  Hematological: Does not bruise/bleed easily.  Psychiatric/Behavioral: Negative for self-injury.    Allergies  Review of patient's allergies indicates no known allergies.  Home Medications   Prior to Admission  medications   Medication Sig Start Date End Date Taking? Authorizing Provider  cloNIDine (CATAPRES) 0.2 MG tablet TAKE 1 TABLET (0.2 MG TOTAL) BY MOUTH DAILY. 05/01/15   David S Tysinger, PA-C  ferrous fumarate (HEMOCYTE - 106 MG FE) 325 (106 FE) MG TABS tablet Take 1 tablet by mouth.    Historical Provider, MD  ibuprofen (ADVIL,MOTRIN) 200 MG tablet Take 600 mg by mouth every 6 (six) hours as needed (Pain).    Historical Provider, MD  losartan-hydrochlorothiazide (HYZAAR) 100-12.5 MG per tablet TAKE 1 TABLET BY MOUTH DAILY. 04/17/15   David S Tysinger, PA-C  Na Sulfate-K Sulfate-Mg  Sulf (SUPREP BOWEL PREP) SOLN Take 1 kit by mouth once. suprep as directed.  No substitutions 07/04/14   Carl E Gessner, MD   BP 153/88 mmHg  Pulse 89  Temp(Src) 98.6 F (37 C) (Oral)  Resp 20  Ht 5' 10" (1.778 m)  Wt 296 lb 6 oz (134.435 kg)  BMI 42.53 kg/m2  SpO2 97%  LMP 06/29/2015 Physical Exam  Constitutional: She is oriented to person, place, and time. She appears well-developed and well-nourished. No distress.  HENT:  Head: Normocephalic and atraumatic.  Eyes: EOM and lids are normal. Lids are everted and swept, no foreign bodies found. Right eye exhibits no discharge. Left eye exhibits no discharge. Right conjunctiva is not injected. Right conjunctiva has a hemorrhage. Left conjunctiva is not injected. Left conjunctiva has no hemorrhage. No scleral icterus.  Slit lamp exam:      The right eye shows no corneal abrasion, no corneal flare, no corneal ulcer, no foreign body, no hyphema, no hypopyon and no fluorescein uptake.  Right lid without edema, tenderness, and warmth; no foreign body noted No abrasions or ulcerations seen in right eye with woods lamp Pressure Right eye: 12 Limited fundoscopic exam unremarkable.   Neck: Normal range of motion. Neck supple.  Cardiovascular: Normal rate.   Pulmonary/Chest: Effort normal.  Neurological: She is alert and oriented to person, place, and time.  Skin: She is not diaphoretic.  Psychiatric: She has a normal mood and affect.  Nursing note and vitals reviewed.   ED Course  Procedures   DIAGNOSTIC STUDIES:  Oxygen Saturation is 97% on RA, normal by my interpretation.    COORDINATION OF CARE:  10:13 AM Discussed treatment plan with pt at bedside and pt agreed to plan. 10:26 AM  Tetracaine and fluorescein stain applied to right eye. Right eye then visualized with woods lamp and slit lamp 10:36 AM Will discharge with opthalmology referral   Labs Review Labs Reviewed - No data to display  Imaging Review No results  found.    EKG Interpretation None      MDM   Final diagnoses:  Subconjunctival hemorrhage of right eye    Afebrile, nontoxic patient with subconjunctival hemorrhage of right eye with no known cause.  Eye exam otherwise unremarkable.  Has hypertension but takes medication regularly.  Pt referred to ophthalmology, will also need eye exam as pt has very poor vision at baseline.  D/C home with ophthalmology follow up.  Discussed result, findings, treatment, and follow up  with patient.  Pt given return precautions.  Pt verbalizes understanding and agrees with plan.       I personally performed the services described in this documentation, which was scribed in my presence. The recorded information has been reviewed and is accurate.    Emily West, PA-C 08/03/15 1254  Erin Schlossman, MD 08/05/15 0959 

## 2015-08-16 ENCOUNTER — Encounter: Payer: Self-pay | Admitting: Medical

## 2015-09-09 ENCOUNTER — Encounter: Payer: Self-pay | Admitting: Medical

## 2015-09-23 ENCOUNTER — Other Ambulatory Visit: Payer: Self-pay | Admitting: Medical

## 2015-09-24 NOTE — Telephone Encounter (Signed)
Is this okay pt canceled two appointments and no showed one

## 2018-05-14 ENCOUNTER — Encounter (HOSPITAL_COMMUNITY): Payer: Self-pay

## 2018-05-14 ENCOUNTER — Ambulatory Visit (INDEPENDENT_AMBULATORY_CARE_PROVIDER_SITE_OTHER): Payer: Managed Care, Other (non HMO)

## 2018-05-14 ENCOUNTER — Ambulatory Visit (HOSPITAL_COMMUNITY)
Admission: EM | Admit: 2018-05-14 | Discharge: 2018-05-14 | Disposition: A | Discharge status: Home / Self Care | Payer: Managed Care, Other (non HMO) | Attending: Internal Medicine | Admitting: Internal Medicine

## 2018-05-14 DIAGNOSIS — R05 Cough: Secondary | ICD-10-CM

## 2018-05-14 DIAGNOSIS — R0602 Shortness of breath: Secondary | ICD-10-CM | POA: Diagnosis not present

## 2018-05-14 DIAGNOSIS — J209 Acute bronchitis, unspecified: Secondary | ICD-10-CM | POA: Diagnosis not present

## 2018-05-14 MED ORDER — HYDROCOD POLST-CPM POLST ER 10-8 MG/5ML PO SUER: 5.0000 mL | Freq: Two times a day (BID) | ORAL | 0 refills | Status: AC | PRN

## 2018-05-14 MED ORDER — LOSARTAN POTASSIUM-HCTZ 100-12.5 MG PO TABS: 1.0000 | ORAL_TABLET | Freq: Every day | ORAL | 1 refills | Status: DC

## 2018-05-14 MED ORDER — CLONIDINE HCL 0.2 MG PO TABS: 0.2000 mg | ORAL_TABLET | Freq: Every day | ORAL | 1 refills | Status: DC

## 2018-05-14 NOTE — ED Triage Notes (Signed)
Pt presents with productive cough x 2 weeks. 

## 2018-05-14 NOTE — ED Provider Notes (Signed)
[name]    CSN: [id number] Arrival date & time: 05/14/18  1418     History   Chief Complaint Chief Complaint  Patient presents with  . Cough    HPI [name] is a 57 y.o. female.   Comes in today for 2 week duration of nasal congestion and productive coughing. She had fever initially but resolved. She no longer has sore throat and sneezing. She had shortness of breath and maybe wheezing but this resolved as well. No chest pain, headache, dizziness. Denies any alleviating or aggravating factors.   BP noted to have elevated, she does have hx of HTN. She haven't taken her medicine for "a while", she would like refills of her BP medications.      Past Medical History:  Diagnosis Date  . Hypertension   . Left leg DVT ([place]) 2006   s/p C-section, 1 month hospitaliation for staph infection and period of immobility  . Obesity   . Staphylococcal infection of skin 2006   1 month hospitalization, s/p C section    Patient Active Problem List   Diagnosis Date Noted  . Obesity, unspecified 01/31/2014  . TOBACCO USER 09/14/2009  . SEBACEOUS CYST 06/17/2007  . 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 DEGENERATIVE JOINT DISEASE, KNEE 05/30/2007  . 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 BUNIONS, BILATERAL 05/27/2007  . LEG PAIN, RIGHT 05/27/2007  . PES PLANUS 05/27/2007  . HYPERTENSION, BENIGN 02/18/2007  . BAKERS CYST 02/03/2007    Past Surgical History:  Procedure Laterality Date  . ABDOMINAL EXPLORATION SURGERY  2006   staph infection s/p C-section  . CESAREAN SECTION     staph infection s/p childbirth by C-section, then Exploratory abdominal surgery done.  . TUBAL LIGATION  2006    OB History   None      Home Medications    Prior to Admission medications   Medication Sig Start Date End Date Taking? Authorizing Provider  chlorpheniramine-HYDROcodone (TUSSIONEX PENNKINETIC ER) 10-8 MG/5ML SUER Take 5 mLs by mouth every 12 (twelve) hours as needed for up to 7 days for  cough. 05/14/18 05/21/18  Barry Dienes, NP  cloNIDine (CATAPRES) 0.2 MG tablet Take 1 tablet (0.2 mg total) by mouth daily. 05/14/18 06/13/18  Barry Dienes, NP  ferrous fumarate (HEMOCYTE - 106 MG FE) 325 (106 FE) MG TABS tablet Take 1 tablet by mouth.    [provider]  ibuprofen (ADVIL,MOTRIN) 200 MG tablet Take 600 mg by mouth every 6 (six) hours as needed (Pain).    [provider]  losartan-hydrochlorothiazide (HYZAAR) 100-12.5 MG tablet Take 1 tablet by mouth daily. 05/14/18 06/13/18  Barry Dienes, NP  Na Sulfate-K Sulfate-Mg Sulf (SUPREP BOWEL PREP) SOLN Take 1 kit by mouth once. suprep as directed.  No substitutions 07/04/14   Gatha Mayer, MD    Family History Family History  Problem Relation Age of Onset  . Hypertension Mother   . Diabetes Mother   . Kidney disease Father        transplant, and subsequent failure   . Pneumonia Father        died of pneumonia  . Hypertension Father   . Cancer Brother        stomach  . Alcohol abuse Brother   . Ulcers Brother        bleeding ulcer related to alcohol abuse  . Heart disease Neg Hx   . Stroke Neg Hx   . Colon cancer Neg Hx     Social History  Social History   Tobacco Use  . Smoking status: Current Every Day Smoker    Packs/day: 0.50    Years: 20.00    Pack years: 10.00    Types: Cigarettes  . Smokeless tobacco: Never Used  Substance Use Topics  . Alcohol use: No  . Drug use: No     Allergies   Patient has no known allergies.   Review of Systems Review of Systems  Constitutional:       As stated in the HPI     Physical Exam Triage Vital Signs ED Triage Vitals  Enc Vitals Group     BP 05/14/18 1504 (!) 179/86     Pulse Rate 05/14/18 1504 92     Resp 05/14/18 1504 20     Temp 05/14/18 1504 98.9 F (37.2 C)     Temp src --      SpO2 05/14/18 1504 100 %     Weight --      Height --      Head Circumference --      Peak Flow --      Pain Score 05/14/18 1503 0     Pain Loc --      Pain Edu?  --      Excl. in [place]? --    No data found.  Updated Vital Signs BP (!) 179/86   Pulse 92   Temp 98.9 F (37.2 C)   Resp 20   LMP 04/30/2018 (Exact Date)   SpO2 100%   Visual Acuity Right Eye Distance:   Left Eye Distance:   Bilateral Distance:    Right Eye Near:   Left Eye Near:    Bilateral Near:     Physical Exam  Constitutional: She is oriented to person, place, and time. She appears well-developed and well-nourished.  HENT:  Head: Normocephalic and atraumatic.  Right Ear: External ear normal.  Left Ear: External ear normal.  Nose: Nose normal.  Mouth/Throat: Oropharynx is clear and moist. No oropharyngeal exudate.  Eyes: Pupils are equal, round, and reactive to light. Conjunctivae and EOM are normal.  Neck: Normal range of motion. Neck supple.  Cardiovascular: Normal rate, regular rhythm and normal heart sounds.  No murmur heard. Pulmonary/Chest: Effort normal and breath sounds normal. She has no wheezes.  Abdominal: Soft. Bowel sounds are normal. There is no tenderness.  Neurological: She is alert and oriented to person, place, and time.  Skin: Skin is warm and dry.  Psychiatric: She has a normal mood and affect.  Nursing note and vitals reviewed.    UC Treatments / Results  Labs (all labs ordered are listed, but only abnormal results are displayed) Labs Reviewed - No data to display  EKG None  Radiology Dg Chest 2 View  Result Date: 05/14/2018 CLINICAL DATA:  Cough, fever, and shortness of breath for 2 weeks. EXAM: CHEST - 2 VIEW COMPARISON:  None. FINDINGS: The heart size and mediastinal contours are within normal limits. Both lungs are clear. The visualized skeletal structures are unremarkable. IMPRESSION: No active cardiopulmonary disease. Electronically Signed   By: Earle Gell M.D.   On: 05/14/2018 16:30    Procedures Procedures (including critical care time)  Medications Ordered in UC Medications - No data to display  Initial Impression /  Assessment and Plan / UC Course  I have reviewed the triage vital signs and the nursing notes.  Pertinent labs & imaging results that were available during my care of the patient were reviewed by  me and considered in my medical decision making (see chart for details).  Final Clinical Impressions(s) / UC Diagnoses   Final diagnoses:  Acute bronchitis, unspecified organism   Chest x-ray is negative for pneumonia.  Clinical presentation consistent with acute bronchitis.  Patient educated on the treatment.  Informed that antibiotic is needed at the moment.  Prescription for Tussionex cough syrup given.  Please follow-up with your primary care doctor if you do not improve.  Discharge Instructions   None    ED Prescriptions    Medication Sig Dispense Auth. Provider   losartan-hydrochlorothiazide (HYZAAR) 100-12.5 MG tablet Take 1 tablet by mouth daily. 30 tablet Barry Dienes, NP   cloNIDine (CATAPRES) 0.2 MG tablet Take 1 tablet (0.2 mg total) by mouth daily. 30 tablet Barry Dienes, NP   chlorpheniramine-HYDROcodone (TUSSIONEX PENNKINETIC ER) 10-8 MG/5ML SUER Take 5 mLs by mouth every 12 (twelve) hours as needed for up to 7 days for cough. 115 mL Barry Dienes, NP     Controlled Substance Prescriptions [place] Controlled Substance Registry consulted? Not Applicable   Barry Dienes, NP 05/14/18 1642

## 2019-08-04 ENCOUNTER — Other Ambulatory Visit: Payer: Self-pay

## 2019-08-04 ENCOUNTER — Encounter (HOSPITAL_COMMUNITY): Payer: Self-pay | Admitting: Emergency Medicine

## 2019-08-04 ENCOUNTER — Emergency Department (HOSPITAL_COMMUNITY)
Admission: EM | Admit: 2019-08-04 | Discharge: 2019-08-04 | Disposition: A | Discharge status: Home / Self Care | Payer: Managed Care, Other (non HMO) | Attending: Emergency Medicine | Admitting: Emergency Medicine

## 2019-08-04 ENCOUNTER — Ambulatory Visit
Admission: EM | Admit: 2019-08-04 | Discharge: 2019-08-04 | Disposition: A | Discharge status: Home / Self Care | Payer: Managed Care, Other (non HMO) | Source: Home / Self Care

## 2019-08-04 ENCOUNTER — Emergency Department (HOSPITAL_COMMUNITY): Payer: Managed Care, Other (non HMO)

## 2019-08-04 DIAGNOSIS — X58XXXA Exposure to other specified factors, initial encounter: Secondary | ICD-10-CM | POA: Insufficient documentation

## 2019-08-04 DIAGNOSIS — Y999 Unspecified external cause status: Secondary | ICD-10-CM | POA: Insufficient documentation

## 2019-08-04 DIAGNOSIS — Y939 Activity, unspecified: Secondary | ICD-10-CM | POA: Diagnosis not present

## 2019-08-04 DIAGNOSIS — S46911A Strain of unspecified muscle, fascia and tendon at shoulder and upper arm level, right arm, initial encounter: Secondary | ICD-10-CM | POA: Diagnosis not present

## 2019-08-04 DIAGNOSIS — R1011 Right upper quadrant pain: Secondary | ICD-10-CM

## 2019-08-04 DIAGNOSIS — Z79899 Other long term (current) drug therapy: Secondary | ICD-10-CM | POA: Diagnosis not present

## 2019-08-04 DIAGNOSIS — F17210 Nicotine dependence, cigarettes, uncomplicated: Secondary | ICD-10-CM | POA: Diagnosis not present

## 2019-08-04 DIAGNOSIS — I161 Hypertensive emergency: Secondary | ICD-10-CM | POA: Diagnosis not present

## 2019-08-04 DIAGNOSIS — Y929 Unspecified place or not applicable: Secondary | ICD-10-CM | POA: Insufficient documentation

## 2019-08-04 DIAGNOSIS — I10 Essential (primary) hypertension: Secondary | ICD-10-CM | POA: Diagnosis not present

## 2019-08-04 LAB — COMPREHENSIVE METABOLIC PANEL
ALT: 17 U/L (ref 0–44)
AST: 17 U/L (ref 15–41)
Albumin: 3.6 g/dL (ref 3.5–5.0)
Alkaline Phosphatase: 79 U/L (ref 38–126)
Anion gap: 11 (ref 5–15)
BUN: 8 mg/dL (ref 6–20)
CO2: 24 mmol/L (ref 22–32)
Calcium: 9.1 mg/dL (ref 8.9–10.3)
Chloride: 102 mmol/L (ref 98–111)
Creatinine, Ser: 0.92 mg/dL (ref 0.44–1.00)
GFR calc Af Amer: >60 mL/min (ref >60)
GFR calc non Af Amer: >60 mL/min (ref >60)
Glucose, Bld: 107 mg/dL — ABNORMAL HIGH (ref 70–99)
Potassium: 3.8 mmol/L (ref 3.5–5.1)
Sodium: 137 mmol/L (ref 135–145)
Total Bilirubin: 0.5 mg/dL (ref 0.3–1.2)
Total Protein: 7.1 g/dL (ref 6.5–8.1)

## 2019-08-04 LAB — CBC
HCT: 45.4 % (ref 36.0–46.0)
Hemoglobin: 14.2 g/dL (ref 12.0–15.0)
MCH: 25.4 pg — ABNORMAL LOW (ref 26.0–34.0)
MCHC: 31.3 g/dL (ref 30.0–36.0)
MCV: 81.1 fL (ref 80.0–100.0)
Platelets: 198 10*3/uL (ref 150–400)
RBC: 5.6 MIL/uL — ABNORMAL HIGH (ref 3.87–5.11)
RDW: 17.4 % — ABNORMAL HIGH (ref 11.5–15.5)
WBC: 10.9 10*3/uL — ABNORMAL HIGH (ref 4.0–10.5)
nRBC: 0 % (ref 0.0–0.2)

## 2019-08-04 LAB — I-STAT BETA HCG BLOOD, ED (MC, WL, AP ONLY): I-stat hCG, quantitative: <5 m[IU]/mL (ref <5)

## 2019-08-04 LAB — LIPASE, BLOOD: Lipase: 22 U/L (ref 11–51)

## 2019-08-04 MED ORDER — MORPHINE SULFATE (PF) 4 MG/ML IV SOLN
4.0000 mg | Freq: Once | INTRAVENOUS | Status: AC
  Administered 2019-08-04: 4 mg via INTRAVENOUS
  Filled 2019-08-04: qty 1

## 2019-08-04 MED ORDER — METHOCARBAMOL 500 MG PO TABS
500.0000 mg | ORAL_TABLET | Freq: Once | ORAL | Status: AC
  Administered 2019-08-04: 500 mg via ORAL
  Filled 2019-08-04: qty 1

## 2019-08-04 MED ORDER — CLONIDINE HCL 0.1 MG PO TABS
0.1000 mg | ORAL_TABLET | Freq: Once | ORAL | Status: AC
  Administered 2019-08-04: 0.1 mg via ORAL
  Filled 2019-08-04: qty 1

## 2019-08-04 MED ORDER — OXYCODONE-ACETAMINOPHEN 5-325 MG PO TABS
1.0000 | ORAL_TABLET | Freq: Once | ORAL | Status: AC
  Administered 2019-08-04: 1 via ORAL
  Filled 2019-08-04: qty 1

## 2019-08-04 MED ORDER — HYDROCHLOROTHIAZIDE 25 MG PO TABS
12.5000 mg | ORAL_TABLET | Freq: Once | ORAL | Status: AC
  Administered 2019-08-04: 12.5 mg via ORAL
  Filled 2019-08-04: qty 1

## 2019-08-04 MED ORDER — CLONIDINE HCL 0.1 MG PO TABS: 0.1000 mg | ORAL_TABLET | Freq: Once | ORAL | Status: DC

## 2019-08-04 MED ORDER — HYDROCODONE-ACETAMINOPHEN 5-325 MG PO TABS: 1.0000 | ORAL_TABLET | Freq: Four times a day (QID) | ORAL | 0 refills | Status: DC | PRN

## 2019-08-04 MED ORDER — LOSARTAN POTASSIUM-HCTZ 100-12.5 MG PO TABS: 1.0000 | ORAL_TABLET | Freq: Every day | ORAL | 0 refills | Status: DC

## 2019-08-04 MED ORDER — CLONIDINE HCL 0.1 MG PO TABS
0.1000 mg | ORAL_TABLET | Freq: Once | ORAL | Status: AC
  Administered 2019-08-04: 0.1 mg via ORAL

## 2019-08-04 MED ORDER — METHOCARBAMOL 500 MG PO TABS: 500.0000 mg | ORAL_TABLET | Freq: Two times a day (BID) | ORAL | 0 refills | Status: DC

## 2019-08-04 MED ORDER — NAPROXEN 250 MG PO TABS
500.0000 mg | ORAL_TABLET | Freq: Once | ORAL | Status: AC
  Administered 2019-08-04: 500 mg via ORAL
  Filled 2019-08-04: qty 2

## 2019-08-04 MED ORDER — CLONIDINE HCL 0.2 MG PO TABS: 0.2000 mg | ORAL_TABLET | Freq: Every day | ORAL | 0 refills | Status: DC

## 2019-08-04 MED ORDER — LOSARTAN POTASSIUM 50 MG PO TABS
100.0000 mg | ORAL_TABLET | Freq: Once | ORAL | Status: AC
  Administered 2019-08-04: 100 mg via ORAL
  Filled 2019-08-04: qty 2

## 2019-08-04 NOTE — Discharge Instructions (Addendum)
58 year old female with history of presenting for constant right shoulder, right thoracic pain that woke her from her sleep Wednesday night that is not alleviated by ibuprofen, worsened by exertion.  Patient hypertensive on arrival: 217/135, given 0.1 mg clonidine at 11:56 AM.  EKG done in office, reviewed by me without previous to compare: Significant for QTC prolongation with T wave inversion in inferior leads, V6.  Repeat blood pressure 207/110.  Patient tachycardic, though not in acute distress.  Referred to ER for further evaluation.

## 2019-08-04 NOTE — ED Triage Notes (Signed)
Pt c/o rt shoulder and rt side pain since Wednesday night. States pain worse laying down and on movement. Denies injury

## 2019-08-04 NOTE — ED Notes (Addendum)
Pt verbally understood discharge instructions and prescription information. Pt stable upon discharge

## 2019-08-04 NOTE — Discharge Instructions (Addendum)
It is important for you to take your blood pressure medications. Take the pain medication muscle relaxer as needed for your shoulder pain. Return to the ED if you start to have worsening symptoms, chest pain, shortness of breath, headache, vision changes, numbness in arms or legs.

## 2019-08-04 NOTE — ED Provider Notes (Addendum)
[facility]    CSN: [id number] Arrival date & time: 08/04/19  1126      History   Chief Complaint Chief Complaint  Patient presents with  . Shoulder Pain    HPI [name] is a 58 y.o. female with history of morbid obesity, left leg DVT (status post C-section 14 years ago), hypertension presenting for sudden onset right shoulder and thoracic pain that woke her from her sleep Wednesday night.  Patient states she was sleeping on her right arm, tried rolling over, felt pain in her right thorax that radiated to her back.  Since then, patient has had a constant, "rough ache "that is only better while sitting still.  Patient denies stabbing, sharp, throbbing pain.  States is worse with deep breath and movement, reclining.  Patient has taken "almost a whole bottle of ibuprofen "without any Aleve ideation.  Patient not currently on any blood thinners, anticoagulation.  Has history of CVA, MI.  Denies chest pain, palpitations, shortness of breath, nausea, vomiting, lightheadedness.  Of note, patient's blood pressure is significantly elevated.  States she used to be on Hyzaar, clonidine, though due to changing jobs, health insurance over the years she has not been on them "in a while ".   Past Medical History:  Diagnosis Date  . Hypertension   . Left leg DVT ([place]) 2006   s/p C-section, 1 month hospitaliation for staph infection and period of immobility  . Obesity   . Staphylococcal infection of skin 2006   1 month hospitalization, s/p C section    Patient Active Problem List   Diagnosis Date Noted  . Obesity, unspecified 01/31/2014  . TOBACCO USER 09/14/2009  . SEBACEOUS CYST 06/17/2007  . 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 DEGENERATIVE JOINT DISEASE, KNEE 05/30/2007  . 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 BUNIONS, BILATERAL 05/27/2007  . LEG PAIN, RIGHT 05/27/2007  . PES PLANUS 05/27/2007  . HYPERTENSION, BENIGN 02/18/2007  . BAKERS CYST 02/03/2007    Past Surgical History:   Procedure Laterality Date  . ABDOMINAL EXPLORATION SURGERY  2006   staph infection s/p C-section  . CESAREAN SECTION     staph infection s/p childbirth by C-section, then Exploratory abdominal surgery done.  . TUBAL LIGATION  2006    OB History   No obstetric history on file.      Home Medications    Prior to Admission medications   Medication Sig Start Date End Date Taking? Authorizing Provider  cloNIDine (CATAPRES) 0.2 MG tablet Take 1 tablet (0.2 mg total) by mouth daily. 05/14/18 06/13/18  Barry Dienes, NP  ferrous fumarate (HEMOCYTE - 106 MG FE) 325 (106 FE) MG TABS tablet Take 1 tablet by mouth.    [provider]  ibuprofen (ADVIL,MOTRIN) 200 MG tablet Take 600 mg by mouth every 6 (six) hours as needed (Pain).    [provider]  losartan-hydrochlorothiazide (HYZAAR) 100-12.5 MG tablet Take 1 tablet by mouth daily. 05/14/18 06/13/18  Barry Dienes, NP  Na Sulfate-K Sulfate-Mg Sulf (SUPREP BOWEL PREP) SOLN Take 1 kit by mouth once. suprep as directed.  No substitutions 07/04/14   Gatha Mayer, MD    Family History Family History  Problem Relation Age of Onset  . Hypertension Mother   . Diabetes Mother   . Kidney disease Father        transplant, and subsequent failure   . Pneumonia Father        died of pneumonia  . Hypertension Father   . Cancer  Brother        stomach  . Alcohol abuse Brother   . Ulcers Brother        bleeding ulcer related to alcohol abuse  . Heart disease Neg Hx   . Stroke Neg Hx   . Colon cancer Neg Hx     Social History Social History   Tobacco Use  . Smoking status: Current Every Day Smoker    Packs/day: 0.50    Years: 20.00    Pack years: 10.00    Types: Cigarettes  . Smokeless tobacco: Never Used  Substance Use Topics  . Alcohol use: No  . Drug use: No     Allergies   Patient has no known allergies.   Review of Systems Review of Systems  Constitutional: Negative for fatigue and fever.  HENT: Negative for ear  pain, sinus pain, sore throat and voice change.   Eyes: Negative for pain, redness and visual disturbance.  Respiratory: Negative for cough and shortness of breath.   Cardiovascular: Negative for chest pain and palpitations.  Gastrointestinal: Negative for abdominal pain, diarrhea and vomiting.  Musculoskeletal: Negative for myalgias.       Positive for right shoulder pain, right thoracic pain  Skin: Negative for rash and wound.  Neurological: Negative for syncope and headaches.     Physical Exam Triage Vital Signs ED Triage Vitals  Enc Vitals Group     BP 08/04/19 1136 (!) 217/135     Pulse Rate 08/04/19 1136 (!) 111     Resp 08/04/19 1136 20     Temp 08/04/19 1136 98.6 F (37 C)     Temp Source 08/04/19 1136 Oral     SpO2 08/04/19 1136 96 %     Weight --      Height --      Head Circumference --      Peak Flow --      Pain Score 08/04/19 1138 8     Pain Loc --      Pain Edu? --      Excl. in [place]? --    No data found.  Updated Vital Signs BP (!) 207/110 (BP Location: Left Arm)   Pulse (!) 101   Temp 98.6 F (37 C) (Oral)   Resp 20   SpO2 96%   Visual Acuity Right Eye Distance:   Left Eye Distance:   Bilateral Distance:    Right Eye Near:   Left Eye Near:    Bilateral Near:     Physical Exam Constitutional:      General: She is not in acute distress. HENT:     Head: Normocephalic and atraumatic.     Mouth/Throat:     Mouth: Mucous membranes are moist.  Eyes:     General: No scleral icterus.    Conjunctiva/sclera: Conjunctivae normal.     Pupils: Pupils are equal, round, and reactive to light.  Neck:     Musculoskeletal: Neck supple. No muscular tenderness.     Comments: Negative JVD, thyromegaly.  Trachea midline Cardiovascular:     Rate and Rhythm: Normal rate and regular rhythm.     Heart sounds: No murmur. No gallop.      Comments: Distant heart sounds, likely due to patient's habitus Pulmonary:     Effort: Pulmonary effort is normal.   Abdominal:     Comments: Obese, per patient her UC distended.  Tender under right breast over ribs without fluctuance, warmth, mass.  Musculoskeletal:  Right lower leg: No edema.     Left lower leg: No edema.     Comments: Negative Homan's sign  Lymphadenopathy:     Cervical: No cervical adenopathy.  Skin:    Coloration: Skin is not jaundiced or pale.  Neurological:     Mental Status: She is alert and oriented to person, place, and time.      UC Treatments / Results  Labs (all labs ordered are listed, but only abnormal results are displayed) Labs Reviewed - No data to display  EKG   Radiology No results found.  Procedures Procedures (including critical care time)  Medications Ordered in UC Medications  cloNIDine (CATAPRES) tablet 0.1 mg (0.1 mg Oral Given 08/04/19 1156)    Initial Impression / Assessment and Plan / UC Course  I have reviewed the triage vital signs and the nursing notes.  Pertinent labs & imaging results that were available during my care of the patient were reviewed by me and considered in my medical decision making (see chart for details).     1.  Hypertensive emergency Patient blood pressure significantly elevated upon arrival: 217/135.  0.1 mg of clonidine administered, which patient tolerated well.  Repeat blood pressure 207/110.  Patient tachycardic throughout visit, though denies chest pain.  EKG done in office due to vitals, chronic lack of access to care, reviewed by me without previous to compare to: Normal sinus rhythm with QTC prolongation (467) and T wave inversions in leads II, 3, aVF, V6.  Consulted with Loura Halt, NP, who agreed with assessment/plan: Given abnormal EKG, patient's risk factors, significantly elevated blood pressure readings, recommended patient go to ER for further evaluation.  Patient in stable condition, husband is present to transport. Final Clinical Impressions(s) / UC Diagnoses   Final diagnoses:  Hypertensive  emergency     Discharge Instructions     58 year old female with history of presenting for constant right shoulder, right thoracic pain that woke her from her sleep Wednesday night that is not alleviated by ibuprofen, worsened by exertion.  Patient hypertensive on arrival: 217/135, given 0.1 mg clonidine at 11:56 AM.  EKG done in office, reviewed by me without previous to compare: Significant for QTC prolongation with T wave inversion in inferior leads, V6.  Repeat blood pressure 207/110.  Patient tachycardic, though not in acute distress.  Referred to ER for further evaluation.    ED Prescriptions    None     Controlled Substance Prescriptions [place] Controlled Substance Registry consulted? Not Applicable   Quincy Sheehan, PA-C 08/04/19 1350    [place], [place], [place] 08/04/19 1357

## 2019-08-04 NOTE — ED Provider Notes (Signed)
[facility] EMERGENCY DEPARTMENT Provider Note   CSN: [id number] Arrival date & time: 08/04/19  1329     History   Chief Complaint Chief Complaint  Patient presents with   Abdominal Pain   Hypertension   Shoulder Pain    Right sided    HPI [name] is a 58 y.o. female with a past medical history of DVT, obesity, hypertension not compliant with antihypertensives presents to ED for 2-day history of right upper quadrant pain right shoulder pain.  Describes the pain as sharp, intermittent with no specific aggravating or alleviating factor.  She is unsure if the abdominal pain in the shoulder pain are related.  She was never told that she had gallbladder issues in the past.  She denies any injury or incident that incited the pain.  Denies any nausea, vomiting.  She does note mild improvement with NSAIDs.  Denies any changes to bowel movements, chest pain, shortness of breath, headache, vision changes, fever, cough, urinary symptoms.     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   TUBAL LIGATION   2006     OB History   No obstetric history on file.      Home Medications    Prior to Admission medications   Medication Sig Start Date End Date Taking? Authorizing Provider  ferrous fumarate (HEMOCYTE - 106 MG FE) 325 (106 FE) MG TABS tablet Take 1 tablet by mouth.   Yes [provider]  ibuprofen (ADVIL,MOTRIN) 200 MG tablet Take 600 mg by mouth every 6 (six) hours as needed for moderate pain (Pain).    Yes [provider]  cloNIDine (CATAPRES) 0.2 MG tablet Take 1 tablet (0.2 mg total) by mouth daily. 08/04/19   Jarae Nemmers, PA-C  HYDROcodone-acetaminophen (NORCO/VICODIN) 5-325 MG tablet Take 1 tablet by mouth every 6 (six) hours as needed. 08/04/19   Damira Kem, PA-C  losartan-hydrochlorothiazide (HYZAAR) 100-12.5 MG tablet Take 1 tablet by mouth daily. 08/04/19   Afua Hoots, PA-C  methocarbamol (ROBAXIN) 500 MG tablet Take 1 tablet (500 mg total) by mouth 2 (two) times daily. 08/04/19   Lavonna Lampron, PA-C  Na Sulfate-K Sulfate-Mg Sulf (SUPREP BOWEL PREP) SOLN Take 1 kit by mouth once. suprep as directed.  No substitutions Patient not taking: Reported on 08/04/2019 07/04/14   Gatha Mayer, MD    Family History Family History  Problem Relation Age of Onset   Hypertension Mother    Diabetes Mother    Kidney disease Father        transplant, and subsequent failure  Pneumonia Father        died of pneumonia   Hypertension Father    Cancer Brother        stomach   Alcohol abuse Brother    Ulcers Brother        bleeding ulcer related to alcohol abuse   Heart disease Neg Hx    Stroke Neg Hx    Colon cancer Neg Hx     Social History Social History   Tobacco Use   Smoking status: Current Every Day Smoker    Packs/day: 0.50    Years: 20.00    Pack years: 10.00    Types: Cigarettes   Smokeless tobacco: Never Used  Substance Use Topics   Alcohol use: No   Drug use: No     Allergies   Patient has no known allergies.   Review  of Systems Review of Systems  Constitutional: Negative for appetite change, chills and fever.  HENT: Negative for ear pain, rhinorrhea, sneezing and sore throat.   Eyes: Negative for photophobia and visual disturbance.  Respiratory: Negative for cough, chest tightness, shortness of breath and wheezing.   Cardiovascular: Negative for chest pain and palpitations.  Gastrointestinal: Positive for abdominal pain. Negative for blood in stool, constipation, diarrhea, nausea and vomiting.  Genitourinary: Negative for dysuria, hematuria and urgency.  Musculoskeletal: Negative for myalgias.  Skin: Negative for rash.  Neurological: Negative for dizziness, weakness and light-headedness.     Physical Exam Updated Vital Signs BP (!) 231/108    Pulse 96    Temp 98.4 F (36.9 C)    Resp (!) 21    Ht '5\' 2"'  (1.575 m)    Wt 131.5 kg    LMP  (Exact Date)    SpO2 100%    BMI 53.04 kg/m   Physical Exam Vitals signs and nursing note reviewed.  Constitutional:      General: She is not in acute distress.    Appearance: She is well-developed. She is obese.     Comments: Speaking in complete sentences without difficulty.  HENT:     Head: Normocephalic and atraumatic.     Nose: Nose normal.  Eyes:     General: No scleral icterus.       Left eye: No discharge.     Conjunctiva/sclera: Conjunctivae normal.  Neck:     Musculoskeletal: Normal range of motion and neck supple.  Cardiovascular:     Rate and Rhythm: Normal rate and regular rhythm.     Heart sounds: Normal heart sounds. No murmur. No friction rub. No gallop.   Pulmonary:     Effort: Pulmonary effort is normal. No respiratory distress.     Breath sounds: Normal breath sounds.  Abdominal:     General: Bowel sounds are normal. There is no distension.     Palpations: Abdomen is soft.     Tenderness: There is abdominal tenderness in the right upper quadrant. There is no guarding.  Musculoskeletal: Normal range of motion.     Comments: Tenderness  to palpation of right shoulder without changes to range of motion.  2+ radial pulse palpated bilaterally.  Sensation intact to light touch of bilateral upper extremities.  Skin:    General: Skin is warm and dry.     Findings: No rash.  Neurological:     General: No focal deficit present.     Mental Status: She is alert and oriented to person, place, and time.     Cranial Nerves: No cranial nerve  deficit.     Sensory: No sensory deficit.     Motor: No abnormal muscle tone.     Coordination: Coordination normal.      ED Treatments / Results  Labs (all labs ordered are listed, but only abnormal results are displayed) Labs Reviewed  COMPREHENSIVE METABOLIC PANEL - Abnormal; Notable for the following components:      Result Value   Glucose, Bld 107 (*)    All other components within normal limits  CBC - Abnormal; Notable for the following components:   WBC 10.9 (*)    RBC 5.60 (*)    MCH 25.4 (*)    RDW 17.4 (*)    All other components within normal limits  LIPASE, BLOOD  I-STAT BETA HCG BLOOD, ED ([facility], [facility], [facility] ONLY)    EKG None  Radiology US Abdomen Limited Ruq  Result Date: 08/04/2019 CLINICAL DATA:  Right upper quadrant abdominal pain EXAM: ULTRASOUND ABDOMEN LIMITED RIGHT UPPER QUADRANT COMPARISON:  None. FINDINGS: Gallbladder: No gallstones or wall thickening visualized. No sonographic Murphy sign noted by sonographer. Common bile duct: Diameter: 5 mm Liver: No focal lesion identified. Mildly increased hepatic parenchymal echogenicity. Portal vein is patent on color Doppler imaging with normal direction of blood flow towards the liver. Other: None. IMPRESSION: The echogenicity of the liver is mildly increased. This is a nonspecific finding but is most commonly seen with fatty infiltration of the liver. There are no obvious focal liver lesions. Electronically Signed   By: Davina Poke M.D.   On: 08/04/2019 16:11    Procedures Procedures (including critical care  time)  Medications Ordered in ED Medications  methocarbamol (ROBAXIN) tablet 500 mg (has no administration in time range)  naproxen (NAPROSYN) tablet 500 mg (has no administration in time range)  oxyCODONE-acetaminophen (PERCOCET/ROXICET) 5-325 MG per tablet 1 tablet (has no administration in time range)  losartan (COZAAR) tablet 100 mg (100 mg Oral Given 08/04/19 1713)  morphine 4 MG/ML injection 4 mg (4 mg Intravenous Given 08/04/19 1720)  cloNIDine (CATAPRES) tablet 0.1 mg (0.1 mg Oral Given 08/04/19 1909)  hydrochlorothiazide (HYDRODIURIL) tablet 12.5 mg (12.5 mg Oral Given 08/04/19 1909)     Initial Impression / Assessment and Plan / ED Course  I have reviewed the triage vital signs and the nursing notes.  Pertinent labs & imaging results that were available during my care of the patient were reviewed by me and considered in my medical decision making (see chart for details).        58 year old female with past medical history of DVT, obesity, hypertension not compliant with antihypertensives presents to ED for 2-day history of right upper quadrant pain right shoulder pain.  She states that she has had shoulder pain similarly in the past which usually improves over 1 to 2 days but this has not.  Only mild improvement noted with NSAIDs.  On my exam patient is overall well-appearing.  She was sent from urgent care due to her hypertension, 836O systolic.  She was given 0.1 of clonidine but is prescribed 0.2 mg of this.  She is also prescribed Hyzaar.  She is asymptomatic from hypertension, denies any chest pain, headache, vision changes, shortness of breath.  Abdomen is tender in the right upper quadrant.  Lab work is reassuring.  Right upper quadrant is negative for acute abnormality.  Patient was given home medications here as well as pain control.  Shoulder is tender without signs of infection, normal range of motion.  This could be due  to a strain as she states this is happened to her before  due to movement.  No trauma noted so do not feel that imaging is necessary at this time.  Although she is hypertensive, she is not symptomatic from this and remains in no acute distress.  Will discharge home with refill of medications, request for new PCP and pain medication.  Patient agreeable to the plan.  Vies to return for worsening symptoms.  Patient is hemodynamically stable, in NAD, and able to ambulate in the ED. Evaluation does not show pathology that would require ongoing emergent intervention or inpatient treatment. I explained the diagnosis to the patient. Pain has been managed and has no complaints prior to discharge. Patient is comfortable with above plan and is stable for discharge at this time. All questions were answered prior to disposition. Strict return precautions for returning to the ED were discussed. Encouraged follow up with PCP.   An After Visit Summary was printed and given to the patient.   Portions of this note were generated with Lobbyist. Dictation errors may occur despite best attempts at proofreading.   Final Clinical Impressions(s) / ED Diagnoses   Final diagnoses:  RUQ abdominal pain  Essential hypertension  Strain of right shoulder, initial encounter    ED Discharge Orders         Ordered    losartan-hydrochlorothiazide (HYZAAR) 100-12.5 MG tablet  Daily     08/04/19 1743    cloNIDine (CATAPRES) 0.2 MG tablet  Daily     08/04/19 1743    HYDROcodone-acetaminophen (NORCO/VICODIN) 5-325 MG tablet  Every 6 hours PRN     08/04/19 1946    methocarbamol (ROBAXIN) 500 MG tablet  2 times daily     08/04/19 1946           Delia Heady, Hershal Coria 08/04/19 1947    Sherwood Gambler, MD 08/04/19 [phone]

## 2019-08-04 NOTE — ED Triage Notes (Signed)
Pt brought down from the UC for hypertension. Pt originally went to the UC for right shoulder pain and right upper abd pain but they immediately sent her down here to be evaluated for her HTN. Pt reports being on hypertension medications but not taking same.

## 2019-08-18 ENCOUNTER — Ambulatory Visit (INDEPENDENT_AMBULATORY_CARE_PROVIDER_SITE_OTHER): Payer: Managed Care, Other (non HMO) | Admitting: Primary Care

## 2019-08-18 ENCOUNTER — Encounter (INDEPENDENT_AMBULATORY_CARE_PROVIDER_SITE_OTHER): Payer: Self-pay | Admitting: Primary Care

## 2019-08-18 ENCOUNTER — Other Ambulatory Visit: Payer: Self-pay

## 2019-08-18 VITALS — BP 158/96 | HR 86 | Temp 98.2°F | Ht 70.0 in | Wt 319.6 lb

## 2019-08-18 DIAGNOSIS — Z6841 Body Mass Index (BMI) 40.0 and over, adult: Secondary | ICD-10-CM

## 2019-08-18 DIAGNOSIS — F17200 Nicotine dependence, unspecified, uncomplicated: Secondary | ICD-10-CM | POA: Diagnosis not present

## 2019-08-18 DIAGNOSIS — I10 Essential (primary) hypertension: Secondary | ICD-10-CM | POA: Diagnosis not present

## 2019-08-18 DIAGNOSIS — Z23 Encounter for immunization: Secondary | ICD-10-CM | POA: Diagnosis not present

## 2019-08-18 DIAGNOSIS — Z7689 Persons encountering health services in other specified circumstances: Secondary | ICD-10-CM

## 2019-08-18 MED ORDER — AMLODIPINE BESYLATE 10 MG PO TABS: 10.0000 mg | ORAL_TABLET | Freq: Every day | ORAL | 3 refills | Status: DC

## 2019-08-18 NOTE — Patient Instructions (Signed)

## 2019-08-18 NOTE — Progress Notes (Signed)
New Patient Office Visit  Subjective:  Patient ID: [name], female    DOB: 30-Nov-1961  Age: 58 y.o. MRN: [id number]  CC:  Chief Complaint  Patient presents with  . Hospitalization Follow-up    HTN    HPI [name] presents for for establishment of care and hospital follow-up.  No primary care on file emergency room scheduled appointment to for follow-up and establishment of care.  Today she presents with elevated blood pressure 158/96 she denies shortness of breath, headaches, chest pain or lower extremity edema  Past Medical History:  Diagnosis Date  . Hypertension   . Left leg DVT ([place]) 2006   s/p C-section, 1 month hospitaliation for staph infection and period of immobility  . Obesity   . Staphylococcal infection of skin 2006   1 month hospitalization, s/p C section    Past Surgical History:  Procedure Laterality Date  . ABDOMINAL EXPLORATION SURGERY  2006   staph infection s/p C-section  . CESAREAN SECTION     staph infection s/p childbirth by C-section, then Exploratory abdominal surgery done.  . TUBAL LIGATION  2006    Family History  Problem Relation Age of Onset  . Hypertension Mother   . Diabetes Mother   . Kidney disease Father        transplant, and subsequent failure   . Pneumonia Father        died of pneumonia  . Hypertension Father   . Cancer Brother        stomach  . Alcohol abuse Brother   . Ulcers Brother        bleeding ulcer related to alcohol abuse  . Heart disease Neg Hx   . Stroke Neg Hx   . Colon cancer Neg Hx     Social History   Socioeconomic History  . Marital status: Married    Spouse name: Not on file  . Number of children: Not on file  . Years of education: Not on file  . Highest education level: Not on file  Occupational History  . Not on file  Social Needs  . Financial resource strain: Not on file  . Food insecurity    Worry: Not on file    Inability: Not on file  . Transportation needs    Medical: Not  on file    Non-medical: Not on file  Tobacco Use  . Smoking status: Current Every Day Smoker    Packs/day: 0.50    Years: 20.00    Pack years: 10.00    Types: Cigarettes  . Smokeless tobacco: Never Used  Substance and Sexual Activity  . Alcohol use: No  . Drug use: No  . Sexual activity: Yes  Lifestyle  . Physical activity    Days per week: Not on file    Minutes per session: Not on file  . Stress: Not on file  Relationships  . Social Herbalist on phone: Not on file    Gets together: Not on file    Attends religious service: Not on file    Active member of club or organization: Not on file    Attends meetings of clubs or organizations: Not on file    Relationship status: Not on file  . Intimate partner violence    Fear of current or ex partner: Not on file    Emotionally abused: Not on file    Physically abused: Not on file    Forced sexual activity:  Not on file  Other Topics Concern  . Not on file  Social History Narrative   Married, has 3 children, works for [employer], no exercise    ROS Review of Systems  All other systems reviewed and are negative.   Objective:   Today's Vitals: BP (!) 158/96 (BP Location: Right Arm, Patient Position: Sitting, Cuff Size: Large)   Pulse 86   Temp 98.2 F (36.8 C) (Tympanic)   Ht _0  (1.778 m)   Wt (!) 319 lb 9.6 oz (145 kg)   LMP 07/18/2019 (Approximate)   SpO2 94%   BMI 45.86 kg/m   Physical Exam Vitals signs reviewed.  Constitutional:      Appearance: She is obese.  HENT:     Head: Normocephalic.     Right Ear: Tympanic membrane normal.     Left Ear: Tympanic membrane normal.  Neck:     Musculoskeletal: Normal range of motion and neck supple.  Cardiovascular:     Rate and Rhythm: Normal rate and regular rhythm.  Pulmonary:     Effort: Pulmonary effort is normal.     Breath sounds: Normal breath sounds.  Abdominal:     General: Bowel sounds are normal.      Palpations: Abdomen is soft.     Tenderness: There is abdominal tenderness.  Musculoskeletal: Normal range of motion.  Skin:    General: Skin is warm and dry.  Neurological:     General: No focal deficit present.     Mental Status: She is alert and oriented to person, place, and time.  Psychiatric:        Mood and Affect: Mood normal.        Behavior: Behavior normal.     Assessment & Plan:   [name] was seen today for hospitalization follow-up.  Diagnoses and all orders for this visit:  HYPERTENSION, BENIGN Counseled on blood pressure goal of less than 130/80, low-sodium, DASH diet, medication compliance, 150 minutes of moderate intensity exercise per week.  Discussed medication compliance, adverse effects. -     amLODipine (NORVASC) 10 MG tablet; Take 1 tablet (10 mg total) by mouth daily.  Need for immunization against influenza CDC recommends influenza vaccine yearly.  Flu intercanthus respiratory illness caused by influenza virus that affects the nose throat and sometimes the lungs.  Experts believe that liver spread managed by tract was made mainly by tiny droplets made when people with the flu cough sneeze or talk. -     Flu Vaccine QUAD 36+ mos IM  TOBACCO USER Nicotine affect every organ in the body second leading cause of death.  Increased risk for lung cancer and other respiratory diseases recommend cessation.  This will be reminded at each clinical visit. Counseled 10 minutes on smoking sensation.  Class 3 severe obesity due to excess calories without serious comorbidity with body mass index (BMI) of 45.0 to 49.9 in adult [name]) Morbid Obesity is greater than 40 indicating an excess in caloric intake or underlining conditions. This may lead to other co-morbidities. Lifestyle modifications of diet and exercise may reduce obesity.   Encounter to establish care Alexis Mire, NP-C will be your  (PCP) that will  provides both the first contact for a person with an  undiagnosed health concern as well as continuing care of varied medical conditions, not limited by cause, organ system, or diagnosis.    Outpatient Encounter Medications as of 08/18/2019  Medication Sig  . cloNIDine (CATAPRES) 0.2 MG tablet Take  1 tablet (0.2 mg total) by mouth daily.  Marland Kitchen ibuprofen (ADVIL,MOTRIN) 200 MG tablet Take 600 mg by mouth every 6 (six) hours as needed for moderate pain (Pain).   Marland Kitchen losartan-hydrochlorothiazide (HYZAAR) 100-12.5 MG tablet Take 1 tablet by mouth daily.  Marland Kitchen amLODipine (NORVASC) 10 MG tablet Take 1 tablet (10 mg total) by mouth daily.  . ferrous fumarate (HEMOCYTE - 106 MG FE) 325 (106 FE) MG TABS tablet Take 1 tablet by mouth.  . [DISCONTINUED] HYDROcodone-acetaminophen (NORCO/VICODIN) 5-325 MG tablet Take 1 tablet by mouth every 6 (six) hours as needed.  . [DISCONTINUED] methocarbamol (ROBAXIN) 500 MG tablet Take 1 tablet (500 mg total) by mouth 2 (two) times daily.  . [DISCONTINUED] Na Sulfate-K Sulfate-Mg Sulf (SUPREP BOWEL PREP) SOLN Take 1 kit by mouth once. suprep as directed.  No substitutions (Patient not taking: Reported on 08/04/2019)   No facility-administered encounter medications on file as of 08/18/2019.     Follow-up: Return in about 4 weeks (around 09/15/2019) for HTN .  Prescribed Norvasc 10 mg at this visit  Kerin Perna, NP

## 2019-08-24 ENCOUNTER — Inpatient Hospital Stay: Payer: Managed Care, Other (non HMO)

## 2019-09-04 ENCOUNTER — Other Ambulatory Visit (INDEPENDENT_AMBULATORY_CARE_PROVIDER_SITE_OTHER): Payer: Self-pay | Admitting: Primary Care

## 2019-09-04 MED ORDER — LOSARTAN POTASSIUM-HCTZ 100-12.5 MG PO TABS: 1.0000 | ORAL_TABLET | Freq: Every day | ORAL | 1 refills | Status: DC

## 2019-09-05 ENCOUNTER — Other Ambulatory Visit (INDEPENDENT_AMBULATORY_CARE_PROVIDER_SITE_OTHER): Payer: Self-pay | Admitting: Primary Care

## 2019-09-05 MED ORDER — CLONIDINE HCL 0.2 MG PO TABS: 0.2000 mg | ORAL_TABLET | Freq: Every day | ORAL | 3 refills | Status: DC

## 2019-09-18 ENCOUNTER — Encounter (INDEPENDENT_AMBULATORY_CARE_PROVIDER_SITE_OTHER): Payer: Self-pay | Admitting: Primary Care

## 2019-09-18 ENCOUNTER — Ambulatory Visit (INDEPENDENT_AMBULATORY_CARE_PROVIDER_SITE_OTHER): Payer: Managed Care, Other (non HMO) | Admitting: Primary Care

## 2019-09-18 ENCOUNTER — Other Ambulatory Visit: Payer: Self-pay

## 2019-09-18 VITALS — BP 164/89 | HR 95 | Temp 97.5°F | Ht 70.0 in | Wt 321.0 lb

## 2019-09-18 DIAGNOSIS — I10 Essential (primary) hypertension: Secondary | ICD-10-CM | POA: Diagnosis not present

## 2019-09-18 MED ORDER — CLONIDINE HCL 0.2 MG PO TABS: 0.2000 mg | ORAL_TABLET | Freq: Every day | ORAL | 3 refills | Status: DC

## 2019-09-18 MED ORDER — METOPROLOL TARTRATE 25 MG PO TABS: 25.0000 mg | ORAL_TABLET | Freq: Two times a day (BID) | ORAL | 3 refills | Status: DC

## 2019-09-18 MED ORDER — BLOOD PRESSURE MONITORING KIT: 1.0000 | PACK | Freq: Three times a day (TID) | 0 refills | Status: DC

## 2019-09-18 NOTE — Progress Notes (Signed)
Presents for  hypertension evaluation, on previous visit medication was adjusted to include addiing amolodipine 10mg  daily and continue Hyzaar 100-12.5 and clonidine .2mg  twice daily. [name] is only taking clonidine .2mg  at bedtime because during the day it makes her drowsy and sleepy.  Patient partial  adherence with medications.  Current Medication List Current Outpatient Medications on File Prior to Visit  Medication Sig Dispense Refill  . amLODipine (NORVASC) 10 MG tablet Take 1 tablet (10 mg total) by mouth daily. 90 tablet 3  . cloNIDine (CATAPRES) 0.2 MG tablet Take 1 tablet (0.2 mg total) by mouth daily. 30 tablet 3  . ferrous fumarate (HEMOCYTE - 106 MG FE) 325 (106 FE) MG TABS tablet Take 1 tablet by mouth.    ibuprofen (ADVIL,MOTRIN) 200 MG tablet Take 600 mg by mouth every 6 (six) hours as needed for moderate pain (Pain).     [name] losartan-hydrochlorothiazide (HYZAAR) 100-12.5 MG tablet Take 1 tablet by mouth daily. 90 tablet 1   No current facility-administered medications on file prior to visit.    Past Medical History  Past Medical History:  Diagnosis Date  . Hypertension   . Left leg DVT ([facility]) 2006   s/p C-section, 1 month hospitaliation for staph infection and period of immobility  . Obesity   . Staphylococcal infection of skin 2006   1 month hospitalization, s/p C section   Dietary habits include: low sodium diet (DASH)  Exercise habits include: as tolerated  Family / Social history: Hypertension , kidney disease   ASCVD risk factors include- Marland Kitchen  CHA2DS2/VAS Stroke Risk Points      N/A >= 2 Points: High Risk  1 - 1.99 Points: Medium Risk  0 Points: Low Risk    A final score could not be computed because of missing components.: Last  Change: N/A     This score determines the patient's risk of having a stroke if the  patient has atrial fibrillation.      This score is not applicable to this patient. Components are not  calculated.     O:  Physical  Exam Vitals signs reviewed.  Constitutional:      Appearance: Normal appearance.  Cardiovascular:     Rate and Rhythm: Normal rate and regular rhythm.  Pulmonary:     Effort: Pulmonary effort is normal.     Breath sounds: Normal breath sounds.  Musculoskeletal: Normal range of motion.  Neurological:     General: No focal deficit present.     Mental Status: She is alert.      Review of Systems  Genitourinary: Positive for frequency.    Last 3 Office BP readings: BP Readings from Last 3 Encounters:  08/18/19 (!) 158/96  08/04/19 (!) 225/92  08/04/19 (!) 207/110    BMET    Component Value Date/Time   NA 137 08/04/2019 1346   K 3.8 08/04/2019 1346   CL 102 08/04/2019 1346   CO2 24 08/04/2019 1346   GLUCOSE 107 (H) 08/04/2019 1346   BUN 8 08/04/2019 1346   CREATININE 0.92 08/04/2019 1346   CREATININE 0.89 01/31/2014 1612   CALCIUM 9.1 08/04/2019 1346   GFRNONAA >60 08/04/2019 1346   GFRAA >60 08/04/2019 1346    Renal function: CrCl cannot be calculated (Patient's most recent lab result is older than the maximum 21 days allowed.).  Clinical ASCVD:The ASCVD Risk score 08/06/2019 DC Jr., et al., 2013) failed to calculate for the following reasons:   Cannot find a previous HDL  lab   Cannot find a previous total cholesterol lab   A/P: Hypertension longstanding/newly diagnosed currently on amolodipine 10mg  daily and continue Hyzaar 100-12.5 and clonidine .2mg  at bedtime . current medications. BP Goal =130/15mmHg. Patient  Partial adherent with current medications. Reduce clonidine to nightly due to drwsiness  Clonidine taken only at bedtime and heart rate is greater than 90 adding metoprolol 25 mg twice daily -F/u labs ordered - for the future CBC, CMP -Counseled on lifestyle modifications for blood pressure control including reduced dietary sodium, increased exercise, adequate sleep  Results reviewed and written information provided.   Total time in face-to-face counseling  10 minutes dietary restriction . F/U Clinic Visit telle  Patient seen with Genice Rouge

## 2019-09-18 NOTE — Patient Instructions (Addendum)
Goal BP:  For patients younger than 60: Goal BP < 130/80 For patients 60 and older: Goal BP < 150/90. For patients with diabetes: Goal BP < 140/90. Your most recent BP: 164/89  Take your medications faithfully as instructed. Maintain a healthy weight. Get at least 150 minutes of aerobic exercise per week. Minimize salt intake. Minimize alcohol intake Blood pressure kit sent to pharmacy to take Bp at home patient will have a tele visit in 4 weeks   Menopause is a natural decline in reproductive hormones in women that reach their 40s to 76s.  This is the absence of a menstrual cycle for 12 months.  Symptoms can include fatigue, night sweats, hot flashes, or sweating.  Also, common features anxiety, dry skin, irritability, moodiness, reduced sex drive, or vaginal dryness. Black Cohosh is natural and may help with hot flash.

## 2019-10-19 ENCOUNTER — Other Ambulatory Visit: Payer: Self-pay

## 2019-10-19 ENCOUNTER — Ambulatory Visit (INDEPENDENT_AMBULATORY_CARE_PROVIDER_SITE_OTHER): Payer: Managed Care, Other (non HMO) | Admitting: Primary Care

## 2019-10-19 ENCOUNTER — Encounter (INDEPENDENT_AMBULATORY_CARE_PROVIDER_SITE_OTHER): Payer: Self-pay | Admitting: Primary Care

## 2019-10-19 DIAGNOSIS — I10 Essential (primary) hypertension: Secondary | ICD-10-CM | POA: Diagnosis not present

## 2019-10-19 NOTE — Progress Notes (Signed)
Virtual Visit via Telephone Note  I connected with [name] on 10/19/19 at  1:30 PM EST by telephone and verified that I am speaking with the correct person using two identifiers.   I discussed the limitations, risks, security and privacy concerns of performing an evaluation and management service by telephone and the availability of in person appointments. I also discussed with the patient that there may be a patient responsible charge related to this service. The patient expressed understanding and agreed to proceed.   History of Present Illness: [name] is suppose to be having a blood pressure follow up but learn today her insurance would not pay for the Blood pressure monitor. She states she feels better no annoying head ache and has more energy so she is subjectively feeling her Blood pressure has improved.  Past Medical History:  Diagnosis Date  . Hypertension   . Left leg DVT ([place]) 2006   s/p C-section, 1 month hospitaliation for staph infection and period of immobility  . Obesity   . Staphylococcal infection of skin 2006   1 month hospitalization, s/p C section     Observations/Objective: Review of Systems  All other systems reviewed and are negative.  Assessment and Plan: [name] was seen today for hypertension.  Diagnoses and all orders for this visit:  HYPERTENSION, BENIGN Blood pressure reading is unknown no adverse effects from current medications. Discussed  low-sodium diet, medication compliance and encourage 150 minutes of moderate intensity exercise per week. Discussed medication compliance. Schedule in person visit for a blood pressure appointment.  Follow Up Instructions: 1 month Blood pressure follow up   I discussed the assessment and treatment plan with the patient. The patient was provided an opportunity to ask questions and all were answered. The patient agreed with the plan and demonstrated an understanding of the instructions.   The patient  was advised to call back or seek an in-person evaluation if the symptoms worsen or if the condition fails to improve as anticipated.  I provided 10 minutes of non-face-to-face time during this encounter.   Kerin Perna, NP

## 2019-11-17 ENCOUNTER — Other Ambulatory Visit: Payer: Self-pay

## 2019-11-17 ENCOUNTER — Ambulatory Visit (INDEPENDENT_AMBULATORY_CARE_PROVIDER_SITE_OTHER): Payer: Managed Care, Other (non HMO) | Admitting: Primary Care

## 2019-11-17 ENCOUNTER — Encounter (INDEPENDENT_AMBULATORY_CARE_PROVIDER_SITE_OTHER): Payer: Self-pay | Admitting: Primary Care

## 2019-11-17 DIAGNOSIS — I10 Essential (primary) hypertension: Secondary | ICD-10-CM

## 2019-11-17 MED ORDER — LOSARTAN POTASSIUM-HCTZ 100-12.5 MG PO TABS: 1.0000 | ORAL_TABLET | Freq: Every day | ORAL | 1 refills | Status: DC

## 2019-11-17 MED ORDER — METOPROLOL TARTRATE 25 MG PO TABS: 25.0000 mg | ORAL_TABLET | Freq: Two times a day (BID) | ORAL | 3 refills | Status: DC

## 2019-11-17 MED ORDER — BLOOD PRESSURE MONITORING KIT: 1.0000 | PACK | Freq: Three times a day (TID) | 0 refills | Status: AC

## 2019-11-17 MED ORDER — AMLODIPINE BESYLATE 10 MG PO TABS: 10.0000 mg | ORAL_TABLET | Freq: Every day | ORAL | 3 refills | Status: DC

## 2019-11-17 NOTE — Patient Instructions (Signed)
Managing Your Hypertension Hypertension is commonly called high blood pressure. This is when the force of your blood pressing against the walls of your arteries is too strong. Arteries are blood vessels that carry blood from your heart throughout your body. Hypertension forces the heart to work harder to pump blood, and may cause the arteries to become narrow or stiff. Having untreated or uncontrolled hypertension can cause heart attack, stroke, kidney disease, and other problems. What are blood pressure readings? A blood pressure reading consists of a higher number over a lower number. Ideally, your blood pressure should be below 120/80. The first ("top") number is called the systolic pressure. It is a measure of the pressure in your arteries as your heart beats. The second ("bottom") number is called the diastolic pressure. It is a measure of the pressure in your arteries as the heart relaxes. What does my blood pressure reading mean? Blood pressure is classified into four stages. Based on your blood pressure reading, your health care provider may use the following stages to determine what type of treatment you need, if any. Systolic pressure and diastolic pressure are measured in a unit called mm Hg. Normal  Systolic pressure: below 120.  Diastolic pressure: below 80. Elevated  Systolic pressure: 120-129.  Diastolic pressure: below 80. Hypertension stage 1  Systolic pressure: 130-139.  Diastolic pressure: 80-89. Hypertension stage 2  Systolic pressure: 140 or above.  Diastolic pressure: 90 or above. What health risks are associated with hypertension? Managing your hypertension is an important responsibility. Uncontrolled hypertension can lead to:  A heart attack.  A stroke.  A weakened blood vessel (aneurysm).  Heart failure.  Kidney damage.  Eye damage.  Metabolic syndrome.  Memory and concentration problems. What changes can I make to manage my  hypertension? Hypertension can be managed by making lifestyle changes and possibly by taking medicines. Your health care provider will help you make a plan to bring your blood pressure within a normal range. Eating and drinking   Eat a diet that is high in fiber and potassium, and low in salt (sodium), added sugar, and fat. An example eating plan is called the DASH (Dietary Approaches to Stop Hypertension) diet. To eat this way: ? Eat plenty of fresh fruits and vegetables. Try to fill half of your plate at each meal with fruits and vegetables. ? Eat whole grains, such as whole wheat pasta, brown rice, or whole grain bread. Fill about one quarter of your plate with whole grains. ? Eat low-fat diary products. ? Avoid fatty cuts of meat, processed or cured meats, and poultry with skin. Fill about one quarter of your plate with lean proteins such as fish, chicken without skin, beans, eggs, and tofu. ? Avoid premade and processed foods. These tend to be higher in sodium, added sugar, and fat.  Reduce your daily sodium intake. Most people with hypertension should eat less than 1,500 mg of sodium a day.  Limit alcohol intake to no more than 1 drink a day for nonpregnant women and 2 drinks a day for men. One drink equals 12 oz of beer, 5 oz of wine, or 1 oz of hard liquor. Lifestyle  Work with your health care provider to maintain a healthy body weight, or to lose weight. Ask what an ideal weight is for you.  Get at least 30 minutes of exercise that causes your heart to beat faster (aerobic exercise) most days of the week. Activities may include walking, swimming, or biking.  Include exercise   to strengthen your muscles (resistance exercise), such as weight lifting, as part of your weekly exercise routine. Try to do these types of exercises for 30 minutes at least 3 days a week.  Do not use any products that contain nicotine or tobacco, such as cigarettes and e-cigarettes. If you need help quitting,  ask your health care provider.  Control any long-term (chronic) conditions you have, such as high cholesterol or diabetes. Monitoring  Monitor your blood pressure at home as told by your health care provider. Your personal target blood pressure may vary depending on your medical conditions, your age, and other factors.  Have your blood pressure checked regularly, as often as told by your health care provider. Working with your health care provider  Review all the medicines you take with your health care provider because there may be side effects or interactions.  Talk with your health care provider about your diet, exercise habits, and other lifestyle factors that may be contributing to hypertension.  Visit your health care provider regularly. Your health care provider can help you create and adjust your plan for managing hypertension. Will I need medicine to control my blood pressure? Your health care provider may prescribe medicine if lifestyle changes are not enough to get your blood pressure under control, and if:  Your systolic blood pressure is 130 or higher.  Your diastolic blood pressure is 80 or higher. Take medicines only as told by your health care provider. Follow the directions carefully. Blood pressure medicines must be taken as prescribed. The medicine does not work as well when you skip doses. Skipping doses also puts you at risk for problems. Contact a health care provider if:  You think you are having a reaction to medicines you have taken.  You have repeated (recurrent) headaches.  You feel dizzy.  You have swelling in your ankles.  You have trouble with your vision. Get help right away if:  You develop a severe headache or confusion.  You have unusual weakness or numbness, or you feel faint.  You have severe pain in your chest or abdomen.  You vomit repeatedly.  You have trouble breathing. Summary  Hypertension is when the force of blood pumping  through your arteries is too strong. If this condition is not controlled, it may put you at risk for serious complications.  Your personal target blood pressure may vary depending on your medical conditions, your age, and other factors. For most people, a normal blood pressure is less than 120/80.  Hypertension is managed by lifestyle changes, medicines, or both. Lifestyle changes include weight loss, eating a healthy, low-sodium diet, exercising more, and limiting alcohol. This information is not intended to replace advice given to you by your health care provider. Make sure you discuss any questions you have with your health care provider. Document Released: 08/17/2012 Document Revised: 03/17/2019 Document Reviewed: 10/21/2016 Elsevier Patient Education  2020 Else[employer] Inc[employer]  

## 2019-11-17 NOTE — Progress Notes (Signed)
Established Patient Office Visit  Subjective:  Patient ID: [name], female    DOB: 1961/09/08  Age: 58 y.o. MRN: [id number]  CC:  Chief Complaint  Patient presents with  . Follow-up    BP check    HPI [name] presents for blood pressure elevated at this visit 140/87 but vast improvement but not at goal. She denies shortness of breath, headaches, chest pain or lower extremity edema  Past Medical History:  Diagnosis Date  . Hypertension   . Left leg DVT ([place]) 2006   s/p C-section, 1 month hospitaliation for staph infection and period of immobility  . Obesity   . Staphylococcal infection of skin 2006   1 month hospitalization, s/p C section    Past Surgical History:  Procedure Laterality Date  . ABDOMINAL EXPLORATION SURGERY  2006   staph infection s/p C-section  . CESAREAN SECTION     staph infection s/p childbirth by C-section, then Exploratory abdominal surgery done.  . TUBAL LIGATION  2006    Family History  Problem Relation Age of Onset  . Hypertension Mother   . Diabetes Mother   . Kidney disease Father        transplant, and subsequent failure   . Pneumonia Father        died of pneumonia  . Hypertension Father   . Cancer Brother        stomach  . Alcohol abuse Brother   . Ulcers Brother        bleeding ulcer related to alcohol abuse  . Heart disease Neg Hx   . Stroke Neg Hx   . Colon cancer Neg Hx     Social History   Socioeconomic History  . Marital status: Married    Spouse name: Not on file  . Number of children: Not on file  . Years of education: Not on file  . Highest education level: Not on file  Occupational History  . Not on file  Tobacco Use  . Smoking status: Current Every Day Smoker    Packs/day: 0.50    Years: 20.00    Pack years: 10.00    Types: Cigarettes  . Smokeless tobacco: Never Used  Substance and Sexual Activity  . Alcohol use: No  . Drug use: No  . Sexual activity: Yes  Other Topics Concern  . Not on  file  Social History Narrative   Married, has 3 children, works for [employer], no exercise   Social Determinants of Health   Financial Resource Strain:   . Difficulty of Paying Living Expenses: Not on file  Food Insecurity:   . Worried About Charity fundraiser in the Last Year: Not on file  . Ran Out of Food in the Last Year: Not on file  Transportation Needs:   . Lack of Transportation (Medical): Not on file  . Lack of Transportation (Non-Medical): Not on file  Physical Activity:   . Days of Exercise per Week: Not on file  . Minutes of Exercise per Session: Not on file  Stress:   . Feeling of Stress : Not on file  Social Connections:   . Frequency of Communication with Friends and Family: Not on file  . Frequency of Social Gatherings with Friends and Family: Not on file  . Attends Religious Services: Not on file  . Active Member of Clubs or Organizations: Not on file  . Attends Archivist Meetings: Not on file  .  Marital Status: Not on file  Intimate Partner Violence:   . Fear of Current or Ex-Partner: Not on file  . Emotionally Abused: Not on file  . Physically Abused: Not on file  . Sexually Abused: Not on file    Outpatient Medications Prior to Visit  Medication Sig Dispense Refill  . cloNIDine (CATAPRES) 0.2 MG tablet Take 1 tablet (0.2 mg total) by mouth daily. 30 tablet 3  . amLODipine (NORVASC) 10 MG tablet Take 1 tablet (10 mg total) by mouth daily. 90 tablet 3  . losartan-hydrochlorothiazide (HYZAAR) 100-12.5 MG tablet Take 1 tablet by mouth daily. 90 tablet 1  . metoprolol tartrate (LOPRESSOR) 25 MG tablet Take 1 tablet (25 mg total) by mouth 2 (two) times daily. 180 tablet 3  . ferrous fumarate (HEMOCYTE - 106 MG FE) 325 (106 FE) MG TABS tablet Take 1 tablet by mouth.    Marland Kitchen ibuprofen (ADVIL,MOTRIN) 200 MG tablet Take 600 mg by mouth every 6 (six) hours as needed for moderate pain (Pain).     . Blood Pressure Monitoring KIT 1  Bag by Does not apply route 3 (three) times daily. (Patient not taking: Reported on 10/19/2019) 1 kit 0   No facility-administered medications prior to visit.    No Known Allergies  ROS Review of Systems  All other systems reviewed and are negative.     Objective:    Physical Exam  Constitutional: She is oriented to person, place, and time. She appears well-developed and well-nourished.  Cardiovascular: Normal rate and regular rhythm.  Pulmonary/Chest: Effort normal and breath sounds normal.  Abdominal: Bowel sounds are normal.  Musculoskeletal:        General: Normal range of motion.     Cervical back: Neck supple.  Neurological: She is oriented to person, place, and time.  Psychiatric: She has a normal mood and affect.    BP 140/87 (BP Location: Right Arm, Patient Position: Sitting, Cuff Size: Large)   Pulse 69   Temp (!) 97.3 F (36.3 C) (Temporal)   Ht '5\' 2"'  (1.575 m)   Wt (!) 324 lb 3.2 oz (147.1 kg)   SpO2 95%   BMI 59.30 kg/m  Wt Readings from Last 3 Encounters:  11/17/19 (!) 324 lb 3.2 oz (147.1 kg)  09/18/19 (!) 321 lb (145.6 kg)  08/18/19 (!) 319 lb 9.6 oz (145 kg)     Health Maintenance Due  Topic Date Due  . Hepatitis C Screening  10-21-1961  . HIV Screening  04/05/1978  . MAMMOGRAM  04/05/2013  . COLONOSCOPY  04/05/2013  . PAP SMEAR-Modifier  02/16/2017    There are no preventive care reminders to display for this patient.  Lab Results  Component Value Date   TSH 1.238 01/31/2014   Lab Results  Component Value Date   WBC 10.9 (H) 08/04/2019   HGB 14.2 08/04/2019   HCT 45.4 08/04/2019   MCV 81.1 08/04/2019   PLT 198 08/04/2019   Lab Results  Component Value Date   NA 137 08/04/2019   K 3.8 08/04/2019   CO2 24 08/04/2019   GLUCOSE 107 (H) 08/04/2019   BUN 8 08/04/2019   CREATININE 0.92 08/04/2019   BILITOT 0.5 08/04/2019   ALKPHOS 79 08/04/2019   AST 17 08/04/2019   ALT 17 08/04/2019   PROT 7.1 08/04/2019   ALBUMIN 3.6  08/04/2019   CALCIUM 9.1 08/04/2019   ANIONGAP 11 08/04/2019   Lab Results  Component Value Date   CHOL 184 01/31/2014  Lab Results  Component Value Date   HDL 58 01/31/2014   Lab Results  Component Value Date   LDLCALC 104 (H) 01/31/2014   Lab Results  Component Value Date   TRIG 109 01/31/2014   Lab Results  Component Value Date   CHOLHDL 3.2 01/31/2014   Lab Results  Component Value Date   HGBA1C 5.6 01/31/2014      Assessment & Plan:   Problem List Items Addressed This Visit    HYPERTENSION, BENIGN Counseled on blood pressure goal of less than 130/80, low-sodium, DASH diet, medication compliance, 150 minutes of moderate intensity exercise per week. Discussed medication compliance, adverse effects.   Relevant Medications   losartan-hydrochlorothiazide (HYZAAR) 100-12.5 MG tablet   amLODipine (NORVASC) 10 MG tablet   metoprolol tartrate (LOPRESSOR) 25 MG tablet      Meds ordered this encounter  Medications  . losartan-hydrochlorothiazide (HYZAAR) 100-12.5 MG tablet    Sig: Take 1 tablet by mouth daily.    Dispense:  90 tablet    Refill:  1  . amLODipine (NORVASC) 10 MG tablet    Sig: Take 1 tablet (10 mg total) by mouth daily.    Dispense:  90 tablet    Refill:  3  . metoprolol tartrate (LOPRESSOR) 25 MG tablet    Sig: Take 1 tablet (25 mg total) by mouth 2 (two) times daily.    Dispense:  180 tablet    Refill:  3  . Blood Pressure Monitoring KIT    Sig: 1 Bag by Does not apply route 3 (three) times daily.    Dispense:  1 kit    Refill:  0    Follow-up: Return in about 8 weeks (around 01/12/2020) for Bp recheck .    Kerin Perna, NP

## 2020-01-15 ENCOUNTER — Ambulatory Visit (INDEPENDENT_AMBULATORY_CARE_PROVIDER_SITE_OTHER): Payer: BC Managed Care – PPO | Admitting: Primary Care

## 2020-01-15 ENCOUNTER — Other Ambulatory Visit: Payer: Self-pay

## 2020-01-15 ENCOUNTER — Encounter (INDEPENDENT_AMBULATORY_CARE_PROVIDER_SITE_OTHER): Payer: Self-pay | Admitting: Primary Care

## 2020-01-15 DIAGNOSIS — Z760 Encounter for issue of repeat prescription: Secondary | ICD-10-CM

## 2020-01-15 DIAGNOSIS — I10 Essential (primary) hypertension: Secondary | ICD-10-CM

## 2020-01-15 MED ORDER — AMLODIPINE BESYLATE 10 MG PO TABS: 10.0000 mg | ORAL_TABLET | Freq: Every day | ORAL | 1 refills | Status: DC

## 2020-01-15 MED ORDER — LOSARTAN POTASSIUM-HCTZ 100-12.5 MG PO TABS: 1.0000 | ORAL_TABLET | Freq: Every day | ORAL | 1 refills | Status: DC

## 2020-01-15 MED ORDER — CLONIDINE HCL 0.2 MG PO TABS: 0.2000 mg | ORAL_TABLET | Freq: Every day | ORAL | 1 refills | Status: DC

## 2020-01-15 MED ORDER — METOPROLOL TARTRATE 25 MG PO TABS: 25.0000 mg | ORAL_TABLET | Freq: Two times a day (BID) | ORAL | 1 refills | Status: DC

## 2020-01-15 MED ORDER — LOSARTAN POTASSIUM-HCTZ 100-25 MG PO TABS: 1.0000 | ORAL_TABLET | Freq: Every day | ORAL | 1 refills | Status: DC

## 2020-01-15 NOTE — Progress Notes (Signed)
Virtual Visit via Telephone Note  I connected with [name] on 01/15/20 at  3:10 PM EST by telephone and verified that I am speaking with the correct person using two identifiers.   I discussed the limitations, risks, security and privacy concerns of performing an evaluation and management service by telephone and the availability of in person appointments. I also discussed with the patient that there may be a patient responsible charge related to this service. The patient expressed understanding and agreed to proceed.  Increased  History of Present Illness: [name] is having a follow up blood pressure check currently on 4 different blood pressures clonidine .60m at bedtime (also helps with sleep) amlodipine 123m , Hyzaar 100/12.5 both once daily and metoprolol [id number] daily she endorses taking all the medicaation as prescribed her systolic ranged from [id number] diastolic 89-83-38Not at goal. Denies shortness of breath, headaches, chest pain or lower extremity edema   Past Medical History:  Diagnosis Date  . Hypertension   [place]   [place] C-section, 1 month hospitaliation for staph infection and period of immobility  . Obesity   . Staphylococcal infection of skin 2006   1 month hospitalization, s/p C section   Current Outpatient Medications on File Prior to Visit  Medication Sig Dispense Refill  . Blood Pressure Monitoring KIT 1 Bag by Does not apply route 3 (three) times daily. 1 kit 0  . ferrous fumarate (HEMOCYTE - 106 MG FE) 325 (106 FE) MG TABS tablet Take 1 tablet by mouth.    . iMarland Kitchenuprofen (ADVIL,MOTRIN) 200 MG tablet Take 600 mg by mouth every 6 (six) hours as needed for moderate pain (Pain).      No current facility-administered medications on file prior to visit.   Observations/Objective: Review of Systems  All other systems reviewed and are negative.   Assessment and Plan: [name] seen today for blood pressure check.  Diagnoses and  all orders for this visit:  HYPERTENSION, BENIGN Blood pressures remains not at goal ,/= 130/80 she is monitoring sodium in diet not exercising as recommended . Increase Hyzaar 100/25 daily continue current Bp medication  -     cloNIDine (CATAPRES) 0.2 MG tablet; Take 1 tablet (0.2 mg total) by mouth daily. -     amLODipine (NORVASC) 10 MG tablet; Take 1 tablet (10 mg total) by mouth daily.  Other ordersMedication refill/ -     Discontinue: losartan-hydrochlorothiazide (HYZAAR) 100-12.5 MG tablet; Take 1 tablet by mouth daily. -     metoprolol tartrate (LOPRESSOR) 25 MG tablet; Take 1 tablet (25 mg total) by mouth 2 (two) times daily. -     Discontinue: losartan-hydrochlorothiazide (HYZAAR) 100-25 MG tablet; Take 1 tablet by mouth daily.   Follow Up Instructions:    I discussed the assessment and treatment plan with the patient. The patient was provided an opportunity to ask questions and all were answered. The patient agreed with the plan and demonstrated an understanding of the instructions.   The patient was advised to call back or seek an in-person evaluation if the symptoms worsen or if the condition fails to improve as anticipated.  I provided 10 minutes of non-face-to-face time during this encounter.   MicKerin PernaP

## 2020-01-15 NOTE — Progress Notes (Signed)
BP at 8:00 am upon wake up was 154/93 pt took antihypertensives and rechecked Bp at 11am reading was 142/93.  BP ranges: systolic highest 014 lowest 142 Diastolic highest 93 lowest 89

## 2020-01-16 ENCOUNTER — Other Ambulatory Visit (INDEPENDENT_AMBULATORY_CARE_PROVIDER_SITE_OTHER): Payer: Self-pay | Admitting: Primary Care

## 2020-01-16 MED ORDER — LOSARTAN POTASSIUM-HCTZ 100-25 MG PO TABS: 1.0000 | ORAL_TABLET | Freq: Every day | ORAL | 1 refills | Status: DC

## 2020-02-12 ENCOUNTER — Encounter (INDEPENDENT_AMBULATORY_CARE_PROVIDER_SITE_OTHER): Payer: Self-pay | Admitting: Primary Care

## 2020-02-12 ENCOUNTER — Other Ambulatory Visit: Payer: Self-pay

## 2020-02-12 ENCOUNTER — Ambulatory Visit (INDEPENDENT_AMBULATORY_CARE_PROVIDER_SITE_OTHER): Payer: BC Managed Care – PPO | Admitting: Primary Care

## 2020-02-12 DIAGNOSIS — F17210 Nicotine dependence, cigarettes, uncomplicated: Secondary | ICD-10-CM

## 2020-02-12 DIAGNOSIS — F17200 Nicotine dependence, unspecified, uncomplicated: Secondary | ICD-10-CM

## 2020-02-12 DIAGNOSIS — Z0130 Encounter for examination of blood pressure without abnormal findings: Secondary | ICD-10-CM

## 2020-02-12 NOTE — Progress Notes (Signed)
Bp medication taken at 7 am  Bp at 1 pm 134/96  Highest systolic 154.. ranges from 130-150 Highest diastolic 96.. ranges around 89

## 2020-02-18 NOTE — Progress Notes (Signed)
Virtual Visit via Telephone Note  I connected with [name] Gibson on 02/18/20 at  3:10 PM EST by telephone and verified that I am speaking with the correct person using two identifiers.   I discussed the limitations, risks, security and privacy concerns of performing an evaluation and management service by telephone and the availability of in person appointments. I also discussed with the patient that there may be a patient responsible charge related to this service. The patient expressed understanding and agreed to proceed.   History of Present Illness: [name] Gibson is having a telemetry visit today for blood pressure follow-up.  She has been taking her blood pressure as requested with readings ranging from systolic 160 -109 and diastolic ranging from 32-35.  Diastolic still remains uncontrolled.  No heart rate recorded. Denies shortness of breath, headaches, chest pain or lower extremity edema Past Medical History:  Diagnosis Date  . Hypertension   . Left leg DVT ([place]) 2006   s/p C-section, 1 month hospitaliation for staph infection and period of immobility  . Obesity   . Staphylococcal infection of skin 2006   1 month hospitalization, s/p C section   Current Outpatient Medications on File Prior to Visit  Medication Sig Dispense Refill  . amLODipine (NORVASC) 10 MG tablet Take 1 tablet (10 mg total) by mouth daily. 90 tablet 1  . Blood Pressure Monitoring KIT 1 Bag by Does not apply route 3 (three) times daily. 1 kit 0  . cloNIDine (CATAPRES) 0.2 MG tablet Take 1 tablet (0.2 mg total) by mouth daily. 90 tablet 1  . losartan-hydrochlorothiazide (HYZAAR) 100-25 MG tablet Take 1 tablet by mouth daily. 90 tablet 1  . metoprolol tartrate (LOPRESSOR) 25 MG tablet Take 1 tablet (25 mg total) by mouth 2 (two) times daily. 180 tablet 1  . ferrous fumarate (HEMOCYTE - 106 MG FE) 325 (106 FE) MG TABS tablet Take 1 tablet by mouth.    Marland Kitchen ibuprofen (ADVIL,MOTRIN) 200 MG tablet Take 600 mg by  mouth every 6 (six) hours as needed for moderate pain (Pain).      No current facility-administered medications on file prior to visit.   Observations/Objective: Review of Systems  All other systems reviewed and are negative.  Assessment and Plan: [name] was seen today for blood pressure check.  Diagnoses and all orders for this visit:  TOBACCO USER [name] states she is trying to wean herself off all cigarettes due to restrictions set in place and isolation smoking does bring decrease in anxiety.  She is well aware of complications with continuing to smoke cigarettes and high blood pressure.  At each visit we will continue to discuss cessation.  Blood pressure check [name] states she is compliant with metoprolol 25 mg twice daily, amlodipine 10 mg daily, losartan hydrochlorothiazide 100/25 daily and clonidine 0.2 mg at bedtime.  Blood pressure remains uncontrolled.  We discussed tobacco cessation, healthy low-sodium DASH diet and exercising.  Increased risk of heart attack or stroke with comorbidities of hypertension obesity and tobacco abuse. Check blood pressure daily at the same time. Keep a log of all blood pressure readings.  DASH DIET; No salt or low sodium diet  If pressure if greater than 150/100 notify me here at the office.  If it's persistently greater 180/90, go to the Emergency Department.  Take all medication as prescribed.   Avoid smoked meats which are high in sodium content.  Avoid soda which contains sodium and are high in sugar which increases  your risk for diabetes.   Follow Up Instructions:    I discussed the assessment and treatment plan with the patient. The patient was provided an opportunity to ask questions and all were answered. The patient agreed with the plan and demonstrated an understanding of the instructions.   The patient was advised to call back or seek an in-person evaluation if the symptoms worsen or if the condition fails to improve  as anticipated.  I provided 11 minutes of non-face-to-face time during this encounter.   Kerin Perna, NP

## 2020-02-20 DIAGNOSIS — Z23 Encounter for immunization: Secondary | ICD-10-CM | POA: Diagnosis not present

## 2020-02-21 ENCOUNTER — Other Ambulatory Visit: Payer: Self-pay

## 2020-02-21 ENCOUNTER — Ambulatory Visit (INDEPENDENT_AMBULATORY_CARE_PROVIDER_SITE_OTHER): Payer: BC Managed Care – PPO | Admitting: Primary Care

## 2020-02-21 ENCOUNTER — Encounter (INDEPENDENT_AMBULATORY_CARE_PROVIDER_SITE_OTHER): Payer: Self-pay | Admitting: Primary Care

## 2020-02-21 VITALS — BP 160/105 | HR 82 | Temp 96.8°F | Ht 62.0 in | Wt 328.6 lb

## 2020-02-21 DIAGNOSIS — I10 Essential (primary) hypertension: Secondary | ICD-10-CM | POA: Diagnosis not present

## 2020-02-21 DIAGNOSIS — Z1231 Encounter for screening mammogram for malignant neoplasm of breast: Secondary | ICD-10-CM

## 2020-02-21 NOTE — Patient Instructions (Signed)
   Managing Your Hypertension Hypertension is commonly called high blood pressure. This is when the force of your blood pressing against the walls of your arteries is too strong. Arteries are blood vessels that carry blood from your heart throughout your body. Hypertension forces the heart to work harder to pump blood, and may cause the arteries to become narrow or stiff. Having untreated or uncontrolled hypertension can cause heart attack, stroke, kidney disease, and other problems. What are blood pressure readings? A blood pressure reading consists of a higher number over a lower number. Ideally, your blood pressure should be below 120/80. The first ("top") number is called the systolic pressure. It is a measure of the pressure in your arteries as your heart beats. The second ("bottom") number is called the diastolic pressure. It is a measure of the pressure in your arteries as the heart relaxes. What does my blood pressure reading mean? Blood pressure is classified into four stages. Based on your blood pressure reading, your health care provider may use the following stages to determine what type of treatment you need, if any. Systolic pressure and diastolic pressure are measured in a unit called mm Hg. Normal  Systolic pressure: below 120.  Diastolic pressure: below 80. Elevated  Systolic pressure: 120-129.  Diastolic pressure: below 80. Hypertension stage 1  Systolic pressure: 130-139.  Diastolic pressure: 80-89. Hypertension stage 2  Systolic pressure: 140 or above.  Diastolic pressure: 90 or above. What health risks are associated with hypertension? Managing your hypertension is an important responsibility. Uncontrolled hypertension can lead to:  A heart attack.  A stroke.  A weakened blood vessel (aneurysm).  Heart failure.  Kidney damage.  Eye damage.  Metabolic syndrome.  Memory and concentration problems. What changes can I make to manage my  hypertension? Hypertension can be managed by making lifestyle changes and possibly by taking medicines. Your health care provider will help you make a plan to bring your blood pressure within a normal range. Eating and drinking   Eat a diet that is high in fiber and potassium, and low in salt (sodium), added sugar, and fat. An example eating plan is called the DASH (Dietary Approaches to Stop Hypertension) diet. To eat this way: ? Eat plenty of fresh fruits and vegetables. Try to fill half of your plate at each meal with fruits and vegetables. ? Eat whole grains, such as whole wheat pasta, brown rice, or whole grain bread. Fill about one quarter of your plate with whole grains. ? Eat low-fat diary products. ? Avoid fatty cuts of meat, processed or cured meats, and poultry with skin. Fill about one quarter of your plate with lean proteins such as fish, chicken without skin, beans, eggs, and tofu. ? Avoid premade and processed foods. These tend to be higher in sodium, added sugar, and fat.  Reduce your daily sodium intake. Most people with hypertension should eat less than 1,500 mg of sodium a day.  Limit alcohol intake to no more than 1 drink a day for nonpregnant women and 2 drinks a day for men. One drink equals 12 oz of beer, 5 oz of wine, or 1 oz of hard liquor. Lifestyle  Work with your health care provider to maintain a healthy body weight, or to lose weight. Ask what an ideal weight is for you.  Get at least 30 minutes of exercise that causes your heart to beat faster (aerobic exercise) most days of the week. Activities may include walking, swimming, or biking.    Include exercise to strengthen your muscles (resistance exercise), such as weight lifting, as part of your weekly exercise routine. Try to do these types of exercises for 30 minutes at least 3 days a week.  Do not use any products that contain nicotine or tobacco, such as cigarettes and e-cigarettes. If you need help quitting,  ask your health care provider.  Control any long-term (chronic) conditions you have, such as high cholesterol or diabetes. Monitoring  Monitor your blood pressure at home as told by your health care provider. Your personal target blood pressure may vary depending on your medical conditions, your age, and other factors.  Have your blood pressure checked regularly, as often as told by your health care provider. Working with your health care provider  Review all the medicines you take with your health care provider because there may be side effects or interactions.  Talk with your health care provider about your diet, exercise habits, and other lifestyle factors that may be contributing to hypertension.  Visit your health care provider regularly. Your health care provider can help you create and adjust your plan for managing hypertension. Will I need medicine to control my blood pressure? Your health care provider may prescribe medicine if lifestyle changes are not enough to get your blood pressure under control, and if:  Your systolic blood pressure is 130 or higher.  Your diastolic blood pressure is 80 or higher. Take medicines only as told by your health care provider. Follow the directions carefully. Blood pressure medicines must be taken as prescribed. The medicine does not work as well when you skip doses. Skipping doses also puts you at risk for problems. Contact a health care provider if:  You think you are having a reaction to medicines you have taken.  You have repeated (recurrent) headaches.  You feel dizzy.  You have swelling in your ankles.  You have trouble with your vision. Get help right away if:  You develop a severe headache or confusion.  You have unusual weakness or numbness, or you feel faint.  You have severe pain in your chest or abdomen.  You vomit repeatedly.  You have trouble breathing. Summary  Hypertension is when the force of blood pumping  through your arteries is too strong. If this condition is not controlled, it may put you at risk for serious complications.  Your personal target blood pressure may vary depending on your medical conditions, your age, and other factors. For most people, a normal blood pressure is less than 120/80.  Hypertension is managed by lifestyle changes, medicines, or both. Lifestyle changes include weight loss, eating a healthy, low-sodium diet, exercising more, and limiting alcohol. This information is not intended to replace advice given to you by your health care provider. Make sure you discuss any questions you have with your health care provider. Document Revised: 03/17/2019 Document Reviewed: 10/21/2016 Elsevier Patient Education  2020 Elsevier Inc.  

## 2020-02-29 ENCOUNTER — Ambulatory Visit (INDEPENDENT_AMBULATORY_CARE_PROVIDER_SITE_OTHER): Payer: BC Managed Care – PPO | Admitting: Primary Care

## 2020-03-03 NOTE — Progress Notes (Signed)
Subjective:  Patient ID: [name], female    DOB: 23-Oct-1961  Age: 59 y.o. MRN: [id number]  CC: Gynecologic Exam   HPI REGINAE WOLFREY presents for female wellness visit gyn. She voices no complaints or concerns. Blood pressure is elevated uncontrolled Denies shortness of breath, headaches, chest pain or lower extremity edema. Decision made to change her visit to hypertension and refer for mammogram and reschedule for pap.  Outpatient Medications Prior to Visit  Medication Sig Dispense Refill  . amLODipine (NORVASC) 10 MG tablet Take 1 tablet (10 mg total) by mouth daily. 90 tablet 1  . Blood Pressure Monitoring KIT 1 Bag by Does not apply route 3 (three) times daily. 1 kit 0  . cloNIDine (CATAPRES) 0.2 MG tablet Take 1 tablet (0.2 mg total) by mouth daily. 90 tablet 1  . ibuprofen (ADVIL,MOTRIN) 200 MG tablet Take 600 mg by mouth every 6 (six) hours as needed for moderate pain (Pain).     Marland Kitchen losartan-hydrochlorothiazide (HYZAAR) 100-25 MG tablet Take 1 tablet by mouth daily. 90 tablet 1  . metoprolol tartrate (LOPRESSOR) 25 MG tablet Take 1 tablet (25 mg total) by mouth 2 (two) times daily. 180 tablet 1  . ferrous fumarate (HEMOCYTE - 106 MG FE) 325 (106 FE) MG TABS tablet Take 1 tablet by mouth.     No facility-administered medications prior to visit.    ROS Review of Systems  Respiratory: Positive for shortness of breath.        With excertion  All other systems reviewed and are negative.   Objective:  BP (!) 160/105 (BP Location: Right Wrist, Patient Position: Sitting, Cuff Size: Normal)   Pulse 82   Temp (!) 96.8 F (36 C) (Temporal)   Ht _0  (1.575 m)   Wt (!) 328 lb 9.6 oz (149.1 kg)   LMP 07/19/2019 (Approximate)   SpO2 92%   BMI 60.10 kg/m   BP/Weight 02/21/2020 11/17/2019 28/36/6294  Systolic BP 765 465 035  Diastolic BP 465 87 89  Wt. (Lbs) 328.6 324.2 321  BMI 60.1 59.3 46.06    Hypertension  Ms. Crago is compliance with low sodium diet.  She is  compliant with medications. She is not compliant with exercise. Exercise: The patient does not participate in regular exercise at present..  Lab Data BMP Latest Ref Rng & Units 08/04/2019 07/06/2014 01/31/2014  Glucose 70 - 99 mg/dL 107(H) 89 85  BUN 6 - 20 mg/dL _1 Creatinine 0.44 - 1.00 mg/dL 0.92 0.86 0.89  Sodium 135 - 145 mmol/L 137 140 135  Potassium 3.5 - 5.1 mmol/L 3.8 4.0 3.9  Chloride 98 - 111 mmol/L 102 103 104  CO2 22 - 32 mmol/L _2 Calcium 8.9 - 10.3 mg/dL 9.1 9.9 9.1     Physical Exam Vitals reviewed.  Constitutional:      Appearance: Normal appearance. She is obese.  HENT:     Head: Normocephalic.  Cardiovascular:     Rate and Rhythm: Normal rate and regular rhythm.  Abdominal:     General: Abdomen is flat.  Musculoskeletal:        General: Normal range of motion.     Cervical back: Normal range of motion and neck supple.  Skin:    General: Skin is warm and dry.  Neurological:     Mental Status: She is alert and oriented to person, place, and time.  Psychiatric:        Mood and Affect:  Mood normal.        Behavior: Behavior normal.        Thought Content: Thought content normal.        Judgment: Judgment normal.      Assessment & Plan:   1. Breast cancer screening by mammogram Quality metric and health maintenance recommended age 64   - HM MAMMOGRAPHY  HYPERTENSION, BENIGN Counseled on blood pressure goal of less than 130/80, low-sodium, DASH diet, medication compliance, 150 minutes of moderate intensity exercise per week. Discussed medication compliance, adverse effects.  No orders of the defined types were placed in this encounter.   Follow-up: Return for fasting labs( schedule pap).   Kerin Perna NP

## 2020-03-05 ENCOUNTER — Encounter (INDEPENDENT_AMBULATORY_CARE_PROVIDER_SITE_OTHER): Payer: Self-pay | Admitting: Primary Care

## 2020-03-05 ENCOUNTER — Other Ambulatory Visit (HOSPITAL_COMMUNITY)
Admission: RE | Admit: 2020-03-05 | Discharge: 2020-03-05 | Disposition: A | Discharge status: Home / Self Care | Payer: BC Managed Care – PPO | Source: Ambulatory Visit | Attending: Primary Care | Admitting: Primary Care

## 2020-03-05 ENCOUNTER — Other Ambulatory Visit: Payer: Self-pay

## 2020-03-05 ENCOUNTER — Ambulatory Visit (INDEPENDENT_AMBULATORY_CARE_PROVIDER_SITE_OTHER): Payer: BC Managed Care – PPO | Admitting: Primary Care

## 2020-03-05 VITALS — BP 142/89 | HR 82 | Temp 97.3°F | Ht 62.0 in | Wt 329.2 lb

## 2020-03-05 DIAGNOSIS — Z124 Encounter for screening for malignant neoplasm of cervix: Secondary | ICD-10-CM | POA: Insufficient documentation

## 2020-03-05 DIAGNOSIS — Z1211 Encounter for screening for malignant neoplasm of colon: Secondary | ICD-10-CM

## 2020-03-05 DIAGNOSIS — Z1159 Encounter for screening for other viral diseases: Secondary | ICD-10-CM | POA: Diagnosis not present

## 2020-03-05 DIAGNOSIS — Z114 Encounter for screening for human immunodeficiency virus [HIV]: Secondary | ICD-10-CM

## 2020-03-05 DIAGNOSIS — I10 Essential (primary) hypertension: Secondary | ICD-10-CM

## 2020-03-05 NOTE — Patient Instructions (Signed)

## 2020-03-05 NOTE — Progress Notes (Signed)
Pt had some menstrual spotting last week lasted for one day

## 2020-03-05 NOTE — Progress Notes (Signed)
Established Patient Office Visit  Subjective:  Patient ID: [name], female    DOB: 03-10-1961  Age: 59 y.o. MRN: [id number]  CC:  Chief Complaint  Patient presents with  . Gynecologic Exam    HPI [name] presents for well woman gyn visit . Only complaint is vaginal discharge. Blood pressure remains elevated but improved since last visit .   Past Medical History:  Diagnosis Date  . Hypertension   . Left leg DVT ([place]) 2006   s/p C-section, 1 month hospitaliation for staph infection and period of immobility  . Obesity   . Staphylococcal infection of skin 2006   1 month hospitalization, s/p C section    Past Surgical History:  Procedure Laterality Date  . ABDOMINAL EXPLORATION SURGERY  2006   staph infection s/p C-section  . CESAREAN SECTION     staph infection s/p childbirth by C-section, then Exploratory abdominal surgery done.  . TUBAL LIGATION  2006    Family History  Problem Relation Age of Onset  . Hypertension Mother   . Diabetes Mother   . Kidney disease Father        transplant, and subsequent failure   . Pneumonia Father        died of pneumonia  . Hypertension Father   . Cancer Brother        stomach  . Alcohol abuse Brother   . Ulcers Brother        bleeding ulcer related to alcohol abuse  . Heart disease Neg Hx   . Stroke Neg Hx   . Colon cancer Neg Hx     Social History   Socioeconomic History  . Marital status: Married    Spouse name: Not on file  . Number of children: Not on file  . Years of education: Not on file  . Highest education level: Not on file  Occupational History  . Not on file  Tobacco Use  . Smoking status: Current Every Day Smoker    Packs/day: 0.50    Years: 20.00    Pack years: 10.00    Types: Cigarettes  . Smokeless tobacco: Never Used  Substance and Sexual Activity  . Alcohol use: No  . Drug use: No  . Sexual activity: Yes  Other Topics Concern  . Not on file  Social History Narrative   Married,  has 3 children, works for [employer], no exercise   Social Determinants of Health   Financial Resource Strain:   . Difficulty of Paying Living Expenses:   Food Insecurity:   . Worried About Charity fundraiser in the Last Year:   . Arboriculturist in the Last Year:   Transportation Needs:   . Film/video editor (Medical):   Marland Kitchen Lack of Transportation (Non-Medical):   Physical Activity:   . Days of Exercise per Week:   . Minutes of Exercise per Session:   Stress:   . Feeling of Stress :   Social Connections:   . Frequency of Communication with Friends and Family:   . Frequency of Social Gatherings with Friends and Family:   . Attends Religious Services:   . Active Member of Clubs or Organizations:   . Attends Archivist Meetings:   Marland Kitchen Marital Status:   Intimate Partner Violence:   . Fear of Current or Ex-Partner:   . Emotionally Abused:   Marland Kitchen Physically Abused:   . Sexually Abused:     Outpatient  Medications Prior to Visit  Medication Sig Dispense Refill  . amLODipine (NORVASC) 10 MG tablet Take 1 tablet (10 mg total) by mouth daily. 90 tablet 1  . Blood Pressure Monitoring KIT 1 Bag by Does not apply route 3 (three) times daily. 1 kit 0  . cloNIDine (CATAPRES) 0.2 MG tablet Take 1 tablet (0.2 mg total) by mouth daily. 90 tablet 1  . ibuprofen (ADVIL,MOTRIN) 200 MG tablet Take 600 mg by mouth every 6 (six) hours as needed for moderate pain (Pain).     Marland Kitchen losartan-hydrochlorothiazide (HYZAAR) 100-25 MG tablet Take 1 tablet by mouth daily. 90 tablet 1  . metoprolol tartrate (LOPRESSOR) 25 MG tablet Take 1 tablet (25 mg total) by mouth 2 (two) times daily. 180 tablet 1  . ferrous fumarate (HEMOCYTE - 106 MG FE) 325 (106 FE) MG TABS tablet Take 1 tablet by mouth.     No facility-administered medications prior to visit.    No Known Allergies  ROS Review of Systems  Genitourinary: Positive for vaginal discharge.  All other systems reviewed  and are negative.     Objective:    Physical Exam  CONSTITUTIONAL: Well-developed, well-nourished female in no acute distress.  HENT:  Normocephalic, atraumatic, External right and left ear normal.  EYES: Conjunctivae and EOM are normal. Pupils are equal, round, and reactive to light. No scleral icterus.  NECK: Normal range of motion, supple, no masses.  Normal thyroid.  SKIN: Skin is warm and dry. No rash noted. Not diaphoretic. No erythema. No pallor. [place]: Alert and oriented to person, place, and time. Normal reflexes, muscle tone coordination. No cranial nerve deficit noted. PSYCHIATRIC: Normal mood and affect. Normal behavior. Normal judgment and thought content. CARDIOVASCULAR: Normal heart rate noted, regular rhythm RESPIRATORY: Clear to auscultation bilaterally. Effort and breath sounds normal, no problems with respiration noted. BREASTS: Symmetric in size. No masses, skin changes, nipple drainage, or lymphadenopathy. ABDOMEN: Soft, normal bowel sounds, no distention noted.  No tenderness, rebound or guarding.  PELVIC: Normal appearing external genitalia; normal appearing vaginal mucosa and cervix.  No abnormal discharge noted.  Pap smear obtained.  Normal uterine size, no other palpable masses, no uterine or adnexal tenderness. MUSCULOSKELETAL: Normal range of motion. No tenderness.  No cyanosis, clubbing, or edema.  2+ distal pulses. BP (!) 142/89 (BP Location: Right Arm, Patient Position: Sitting, Cuff Size: Large)   Pulse 82   Temp (!) 97.3 F (36.3 C) (Temporal)   Ht 5' 2" (1.575 m)   Wt (!) 329 lb 3.2 oz (149.3 kg)   LMP 07/19/2019 (Approximate)   SpO2 97%   BMI 60.21 kg/m  Wt Readings from Last 3 Encounters:  03/05/20 (!) 329 lb 3.2 oz (149.3 kg)  02/21/20 (!) 328 lb 9.6 oz (149.1 kg)  11/17/19 (!) 324 lb 3.2 oz (147.1 kg)     Health Maintenance Due  Topic Date Due  . Hepatitis C Screening  Never done  . HIV Screening  Never done  . COLONOSCOPY  Never done   . PAP SMEAR-Modifier  02/16/2017    There are no preventive care reminders to display for this patient.  Lab Results  Component Value Date   TSH 1.238 01/31/2014   Lab Results  Component Value Date   WBC 10.9 (H) 08/04/2019   HGB 14.2 08/04/2019   HCT 45.4 08/04/2019   MCV 81.1 08/04/2019   PLT 198 08/04/2019   Lab Results  Component Value Date   NA 137 08/04/2019   K 3.8  08/04/2019   CO2 24 08/04/2019   GLUCOSE 107 (H) 08/04/2019   BUN 8 08/04/2019   CREATININE 0.92 08/04/2019   BILITOT 0.5 08/04/2019   ALKPHOS 79 08/04/2019   AST 17 08/04/2019   ALT 17 08/04/2019   PROT 7.1 08/04/2019   ALBUMIN 3.6 08/04/2019   CALCIUM 9.1 08/04/2019   ANIONGAP 11 08/04/2019   Lab Results  Component Value Date   CHOL 184 01/31/2014   Lab Results  Component Value Date   HDL 58 01/31/2014   Lab Results  Component Value Date   LDLCALC 104 (H) 01/31/2014   Lab Results  Component Value Date   TRIG 109 01/31/2014   Lab Results  Component Value Date   CHOLHDL 3.2 01/31/2014   Lab Results  Component Value Date   HGBA1C 5.6 01/31/2014      Assessment & Plan:  Randilyn was seen today for gynecologic exam.  Diagnoses and all orders for this visit:  Encounter for screening for HIV Quality metrics and health maintenance  -     HIV Antibody (routine testing w rflx)  Colon cancer screening -     Ambulatory referral to Gastroenterology  Cervical cancer screening The USPSTF recommendations screening of cervical cancer every 3 years with cervical cytology.  -     Cytology - PAP([place])  Encounter for hepatitis C screening test for low risk patient Quality metrics and health maintenance  -     Hepatitis C Antibody  HYPERTENSION, BENIGN She cheeks her blood pressure at home and systolic ranges from 993-570 and diastolic 17-79. Continue all antihypertensives as prescribed.  Remember to keep a log of your  blood pressure  for your follow up appointment.   DASH/Mediterranean Diets are healthier choices for HTN.     No orders of the defined types were placed in this encounter.   Follow-up: No follow-ups on file.    Kerin Perna, NP

## 2020-03-06 LAB — HIV ANTIBODY (ROUTINE TESTING W REFLEX): HIV Screen 4th Generation wRfx: NONREACTIVE

## 2020-03-06 LAB — CYTOLOGY - PAP
Adequacy: ABSENT
Diagnosis: NEGATIVE

## 2020-03-06 LAB — HEPATITIS C ANTIBODY: Hep C Virus Ab: 0.4 s/co ratio (ref 0.0–0.9)

## 2020-03-19 DIAGNOSIS — Z23 Encounter for immunization: Secondary | ICD-10-CM | POA: Diagnosis not present

## 2020-05-13 ENCOUNTER — Other Ambulatory Visit: Payer: Self-pay

## 2020-05-13 ENCOUNTER — Telehealth (INDEPENDENT_AMBULATORY_CARE_PROVIDER_SITE_OTHER): Payer: BC Managed Care – PPO | Admitting: Primary Care

## 2020-05-13 ENCOUNTER — Encounter (INDEPENDENT_AMBULATORY_CARE_PROVIDER_SITE_OTHER): Payer: Self-pay | Admitting: Primary Care

## 2020-05-13 DIAGNOSIS — J302 Other seasonal allergic rhinitis: Secondary | ICD-10-CM | POA: Diagnosis not present

## 2020-05-13 DIAGNOSIS — I10 Essential (primary) hypertension: Secondary | ICD-10-CM

## 2020-05-13 DIAGNOSIS — R062 Wheezing: Secondary | ICD-10-CM | POA: Diagnosis not present

## 2020-05-13 MED ORDER — FLUTICASONE PROPIONATE 50 MCG/ACT NA SUSP: 2.0000 | Freq: Every day | NASAL | 6 refills | Status: DC

## 2020-05-13 MED ORDER — DM-GUAIFENESIN ER 30-600 MG PO TB12: 1.0000 | ORAL_TABLET | Freq: Two times a day (BID) | ORAL | 1 refills | Status: DC

## 2020-05-13 MED ORDER — LEVOCETIRIZINE DIHYDROCHLORIDE 5 MG PO TABS: 5.0000 mg | ORAL_TABLET | Freq: Every evening | ORAL | 1 refills | Status: DC

## 2020-05-13 MED ORDER — ALBUTEROL SULFATE HFA 108 (90 BASE) MCG/ACT IN AERS: 2.0000 | INHALATION_SPRAY | Freq: Four times a day (QID) | RESPIRATORY_TRACT | 1 refills | Status: DC | PRN

## 2020-05-13 MED ORDER — LOSARTAN POTASSIUM-HCTZ 100-25 MG PO TABS: 1.0000 | ORAL_TABLET | Freq: Every day | ORAL | 1 refills | Status: DC

## 2020-05-13 NOTE — Progress Notes (Addendum)
Virtual Visit via Telephone Note  I connected with [name] on 05/13/20 at  2:50 PM EDT by telephone and verified that I am speaking with the correct person using two identifiers.   I discussed the limitations, risks, security and privacy concerns of performing an evaluation and management service by telephone and the availability of in person appointments. I also discussed with the patient that there may be a patient responsible charge related to this service. The patient expressed understanding and agreed to proceed. Patient location [place], NP location [facility]  History of Present Illness: [name] is having a tele visit for a productive cough the previous week it was a dry cough and she felt that was caused by her smoking 1/2 ppd. The following week the cough became productive worst at night when trying to sleep became more congested, headache and watery eyes. Explained this is seasonal allergies but the more she talked I would hear  aspiratory wheeze. Past Medical History:  Diagnosis Date  . Hypertension   . Left leg DVT ([place]) 2006   s/p C-section, 1 month hospitaliation for staph infection and period of immobility  . Obesity   . Staphylococcal infection of skin 2006   1 month hospitalization, s/p C section   Current Outpatient Medications on File Prior to Visit  Medication Sig Dispense Refill  . amLODipine (NORVASC) 10 MG tablet Take 1 tablet (10 mg total) by mouth daily. 90 tablet 1  . Blood Pressure Monitoring KIT 1 Bag by Does not apply route 3 (three) times daily. 1 kit 0  . cloNIDine (CATAPRES) 0.2 MG tablet Take 1 tablet (0.2 mg total) by mouth daily. 90 tablet 1  . metoprolol tartrate (LOPRESSOR) 25 MG tablet Take 1 tablet (25 mg total) by mouth 2 (two) times daily. 180 tablet 1  . ferrous fumarate (HEMOCYTE - 106 MG FE) 325 (106 FE) MG TABS tablet Take 1 tablet by mouth.    Marland Kitchen ibuprofen (ADVIL,MOTRIN) 200 MG tablet Take 600 mg by  mouth every 6 (six) hours as needed for moderate pain (Pain).      No current facility-administered medications on file prior to visit.   Observations/Objective: Review of Systems  Respiratory: Positive for wheezing.   Endo/Heme/Allergies: Positive for environmental allergies.  All other systems reviewed and are negative.  Assessment and Plan: [name] was seen today for cough.  Diagnoses and all orders for this visit:  Seasonal allergies It appears that you are struggling with allergies.  Fortunately, this is a common condition and can usually be managed with medication. -     levocetirizine (XYZAL) 5 MG tablet; Take 1 tablet (5 mg total) by mouth every evening. -     dextromethorphan-guaiFENesin (MUCINEX DM) 30-600 MG 12hr tablet; Take 1 tablet by mouth 2 (two) times daily. -     fluticasone (FLONASE) 50 MCG/ACT nasal spray; Place 2 sprays into both nostrils daily.  Wheeze  symptoms which include cough, wheeze, and shortness of breath brought on by characteristic triggers and relieved by bronchodilating medications. albuterol (VENTOLIN HFA) 108 (90 Base) MCG/ACT inhaler; Inhale 2 puffs into the lungs every 6 (six) hours as needed for wheezing or shortness of breath.   HYPERTENSION, BENIGN Counseled on blood pressure goal of less than 130/80, low-sodium, DASH diet, medication compliance, 150 minutes of moderate intensity exercise per week. Discussed medication compliance, adverse effects. Continue -   losartan-hydrochlorothiazide (HYZAAR) 100-25 MG tablet; Take 1 tablet by mouth daily.  Other orders -  albuterol (VENTOLIN HFA) 108 (90 Base) MCG/ACT inhaler; Inhale 2 puffs into the lungs every 6 (six) hours as needed for wheezing or shortness of breath.    Follow Up Instructions:    I discussed the assessment and treatment plan with the patient. The patient was provided an opportunity to ask questions and all were answered. The patient agreed with the plan and demonstrated an  understanding of the instructions.   The patient was advised to call back or seek an in-person evaluation if the symptoms worsen or if the condition fails to improve as anticipated.  I provided 15 minutes of non-face-to-face time during this encounter.   Kerin Perna, NP

## 2020-05-13 NOTE — Progress Notes (Signed)
Pt complains of a dry for the last two weeks. Cough became productive 1 week ago. Describes as a gaging cough- can not lay down at night has to sit up Pt states she can now hear herself wheezing Pt now has congestion for the last three days  No OTC medication is relieving the cough/congestion

## 2020-06-05 ENCOUNTER — Telehealth (INDEPENDENT_AMBULATORY_CARE_PROVIDER_SITE_OTHER): Payer: BC Managed Care – PPO | Admitting: Primary Care

## 2020-06-05 ENCOUNTER — Encounter (INDEPENDENT_AMBULATORY_CARE_PROVIDER_SITE_OTHER): Payer: Self-pay | Admitting: Primary Care

## 2020-06-05 ENCOUNTER — Other Ambulatory Visit: Payer: Self-pay

## 2020-06-05 DIAGNOSIS — R062 Wheezing: Secondary | ICD-10-CM

## 2020-06-05 DIAGNOSIS — I10 Essential (primary) hypertension: Secondary | ICD-10-CM

## 2020-06-05 DIAGNOSIS — F17200 Nicotine dependence, unspecified, uncomplicated: Secondary | ICD-10-CM

## 2020-06-05 MED ORDER — AMLODIPINE BESYLATE 10 MG PO TABS: 10.0000 mg | ORAL_TABLET | Freq: Every day | ORAL | 1 refills | Status: DC

## 2020-06-05 MED ORDER — BUDESONIDE-FORMOTEROL FUMARATE 80-4.5 MCG/ACT IN AERO: 2.0000 | INHALATION_SPRAY | Freq: Two times a day (BID) | RESPIRATORY_TRACT | 3 refills | Status: DC

## 2020-06-05 MED ORDER — CLONIDINE HCL 0.2 MG PO TABS: 0.2000 mg | ORAL_TABLET | Freq: Every day | ORAL | 1 refills | Status: DC

## 2020-06-05 NOTE — Progress Notes (Signed)
Virtual Visit via Telephone Note  I connected with [name] on 06/05/20 at  9:10 AM EDT by telephone and verified that I am speaking with the correct person using two identifiers.   I discussed the limitations, risks, security and privacy concerns of performing an evaluation and management service by telephone and the availability of in person appointments. I also discussed with the patient that there may be a patient responsible charge related to this service. The patient expressed understanding and agreed to proceed.   History of Present Illness: [name] is a 59 year old female having a tele visit for blood pressure follow up Bp this am 145/89 without medication  systolic ranges from 614-431 and diastolic diastolic 54-00 Past Medical History:  Diagnosis Date   Hypertension    Left leg DVT ([place]) 2006   s/p C-section, 1 month hospitaliation for staph infection and period of immobility   Obesity    Staphylococcal infection of skin 2006   1 month hospitalization, s/p C section   Current Outpatient Medications on File Prior to Visit  Medication Sig Dispense Refill   albuterol (VENTOLIN HFA) 108 (90 Base) MCG/ACT inhaler Inhale 2 puffs into the lungs every 6 (six) hours as needed for wheezing or shortness of breath. 18 g 1   Blood Pressure Monitoring KIT 1 Bag by Does not apply route 3 (three) times daily. 1 kit 0   dextromethorphan-guaiFENesin (MUCINEX DM) 30-600 MG 12hr tablet Take 1 tablet by mouth 2 (two) times daily. 60 tablet 1   ferrous fumarate (HEMOCYTE - 106 MG FE) 325 (106 FE) MG TABS tablet Take 1 tablet by mouth.     fluticasone (FLONASE) 50 MCG/ACT nasal spray Place 2 sprays into both nostrils daily. 16 g 6   ibuprofen (ADVIL,MOTRIN) 200 MG tablet Take 600 mg by mouth every 6 (six) hours as needed for moderate pain (Pain).      levocetirizine (XYZAL) 5 MG tablet Take 1 tablet (5 mg total) by mouth every evening. 90 tablet 1    losartan-hydrochlorothiazide (HYZAAR) 100-25 MG tablet Take 1 tablet by mouth daily. 90 tablet 1   metoprolol tartrate (LOPRESSOR) 25 MG tablet Take 1 tablet (25 mg total) by mouth 2 (two) times daily. 180 tablet 1   No current facility-administered medications on file prior to visit.    Observations/Objective: Review of Systems  Respiratory: Positive for shortness of breath and wheezing.   All other systems reviewed and are negative.   Assessment and Plan: [name] was seen today for hypertension.  Diagnoses and all orders for this visit:  Wheeze Added Symbicort after learning the frequency she was using her SABA budesonide-formoterol (SYMBICORT) 80-4.5 MCG/ACT inhaler; Inhale 2 puffs into the lungs 2 (two) times daily.   HYPERTENSION, BENIGN Bp this am 145/89 without medication after medication  systolic [phone] and diastolic 50-93. Blood pressure goal of less than 130/80, low-sodium, DASH diet, medication compliance, 150 minutes of moderate intensity exercise per week. Discussed medication compliance, Continue losartan/HCTZ 100/25 daily -     cloNIDine (CATAPRES) 0.2 MG tablet; Take 1 tablet (0.2 mg total) by mouth daily. -     amLODipine (NORVASC) 10 MG tablet; Take 1 tablet (10 mg total) by mouth daily.  TOBACCO USER .  Increased risk for lung cancer and other respiratory diseases recommend cessation.  This will be reminded at each clinical visit. Each visit she states she is trying  Other orders -     budesonide-formoterol (SYMBICORT) 80-4.5 MCG/ACT inhaler; Inhale  2 puffs into the lungs 2 (two) times daily.    Follow Up Instructions:    I discussed the assessment and treatment plan with the patient. The patient was provided an opportunity to ask questions and all were answered. The patient agreed with the plan and demonstrated an understanding of the instructions.   The patient was advised to call back or seek an in-person evaluation if the symptoms worsen or if the  condition fails to improve as anticipated.  I provided 12 minutes of non-face-to-face time during this encounter.   Kerin Perna, NP

## 2020-06-05 NOTE — Progress Notes (Signed)
Bp this am 145/89 without medication Averaging systolic 135-145 Averaging diastolic 80-89

## 2020-06-07 ENCOUNTER — Telehealth: Payer: Self-pay

## 2020-06-07 NOTE — Telephone Encounter (Signed)
Pt's Symbicort is non-preferred, if appropriate to change--Advair is preferred.

## 2020-06-11 ENCOUNTER — Other Ambulatory Visit (INDEPENDENT_AMBULATORY_CARE_PROVIDER_SITE_OTHER): Payer: Self-pay | Admitting: Primary Care

## 2020-06-11 MED ORDER — FLUTICASONE-SALMETEROL 100-50 MCG/DOSE IN AEPB: 1.0000 | INHALATION_SPRAY | Freq: Two times a day (BID) | RESPIRATORY_TRACT | 3 refills | Status: DC

## 2020-12-09 ENCOUNTER — Other Ambulatory Visit (INDEPENDENT_AMBULATORY_CARE_PROVIDER_SITE_OTHER): Payer: Self-pay | Admitting: Primary Care

## 2020-12-09 DIAGNOSIS — I10 Essential (primary) hypertension: Secondary | ICD-10-CM

## 2020-12-09 NOTE — Telephone Encounter (Signed)
Attempted to call patient to schedule appointment- left message to call office. Courtesy RF #[phone] given 

## 2020-12-24 IMAGING — US ULTRASOUND ABDOMEN LIMITED
1 series · 14 of 25 positions shown · non-contrast
Comparison: None.

CLINICAL DATA: Right upper quadrant abdominal pain

EXAM:
ULTRASOUND ABDOMEN LIMITED RIGHT UPPER QUADRANT

[Series 1: ultrasound abdomen limited · 14 of 37 slices shown]
[im 1/37]
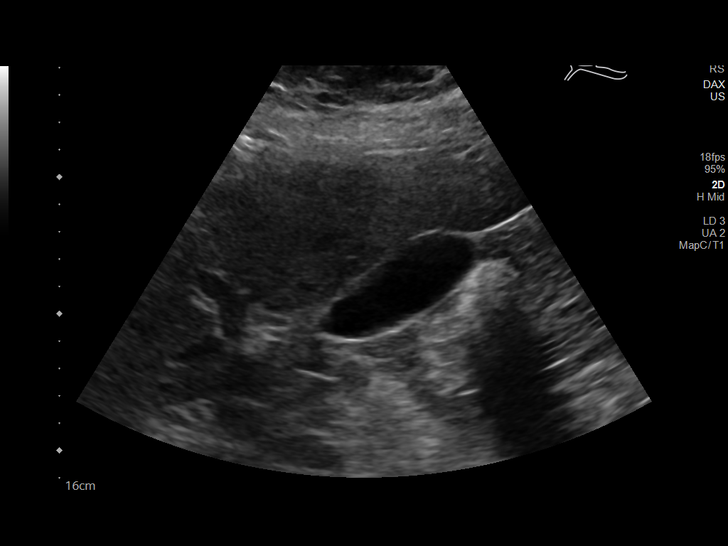
[im 4/37]
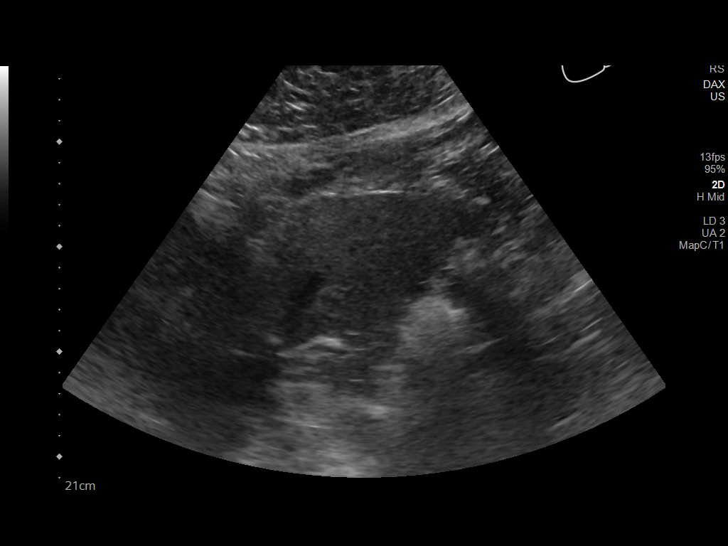
[im 7/37]
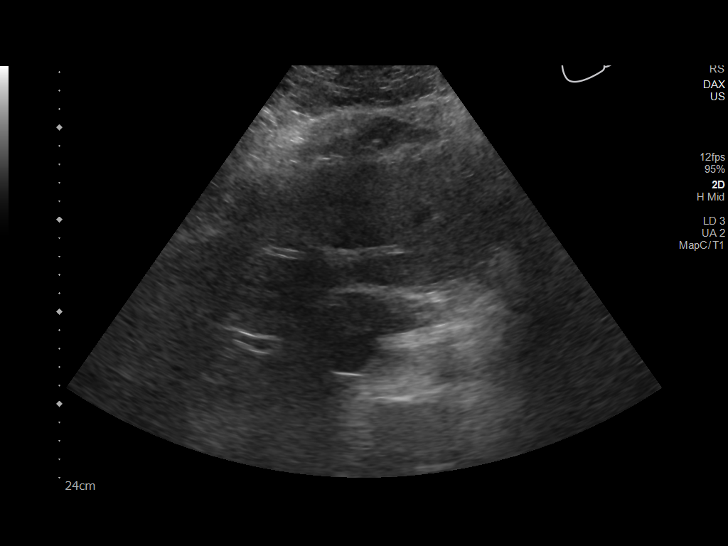
[im 10/37]
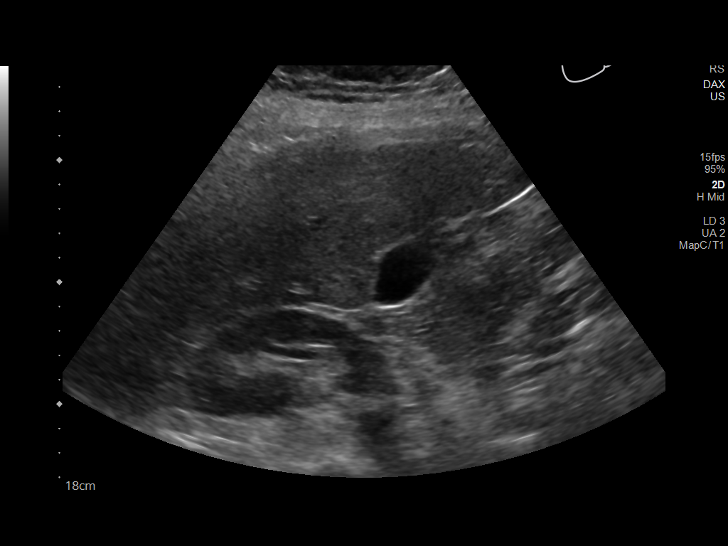
[im 13/37]
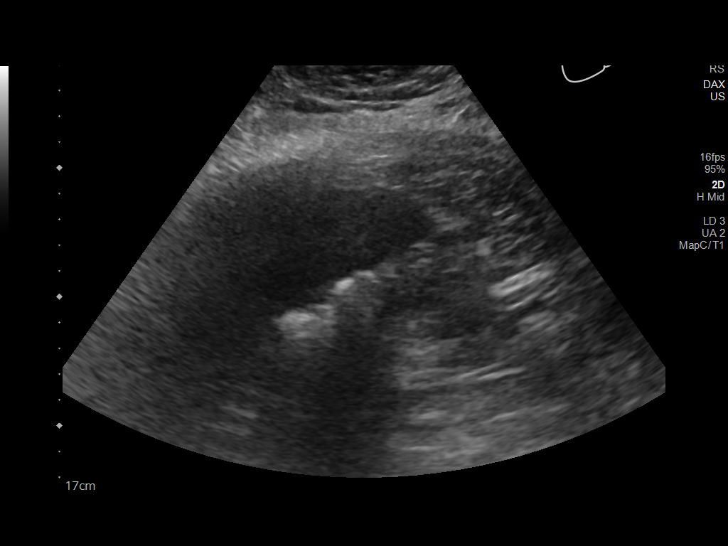
[im 14/37]
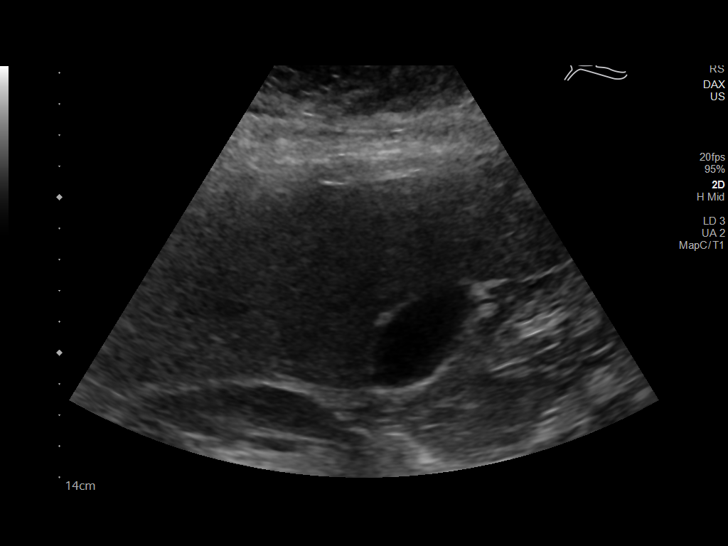
[im 17/37]
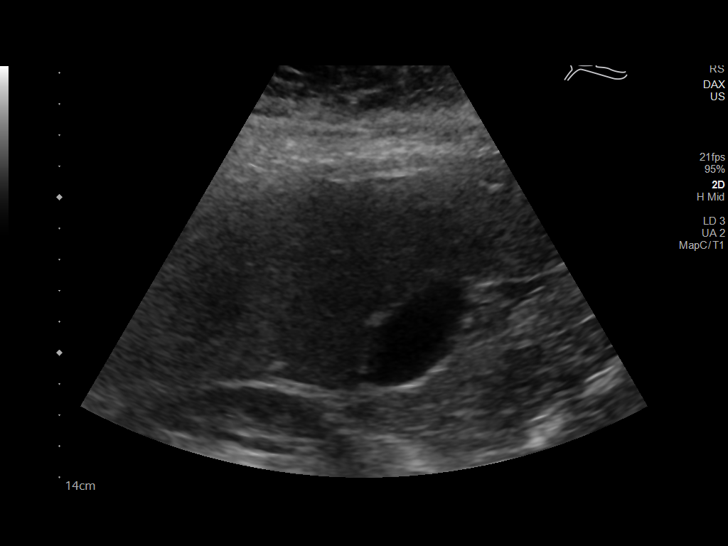
[im 20/37]
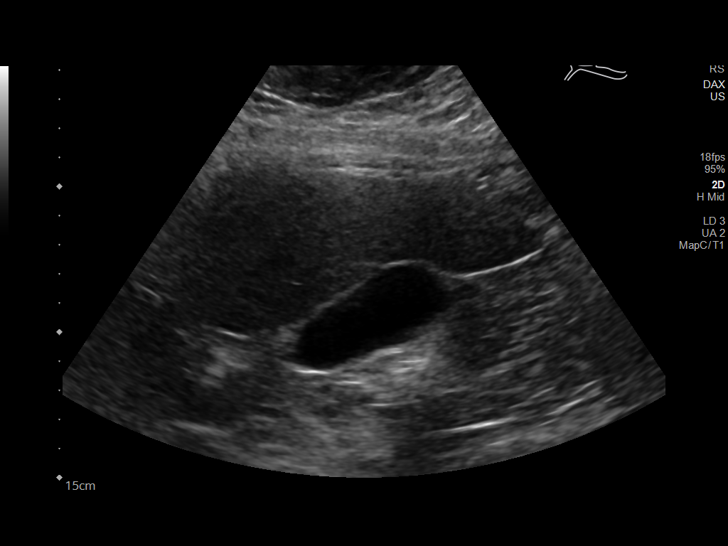
[im 23/37]
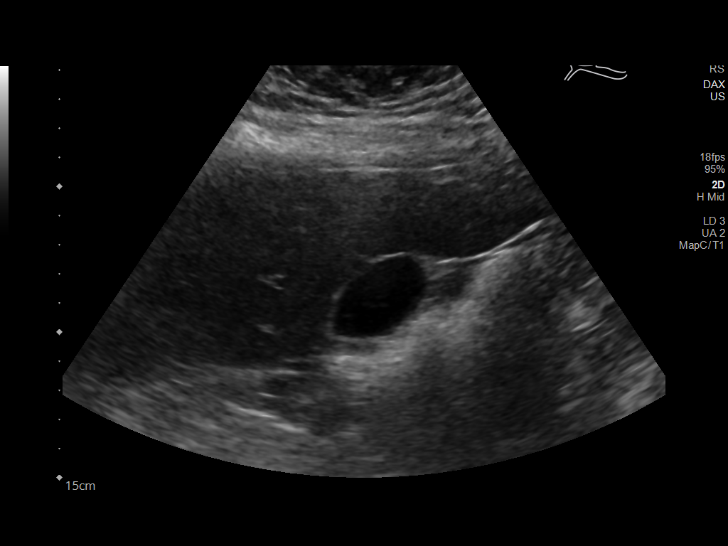
[im 25/37]
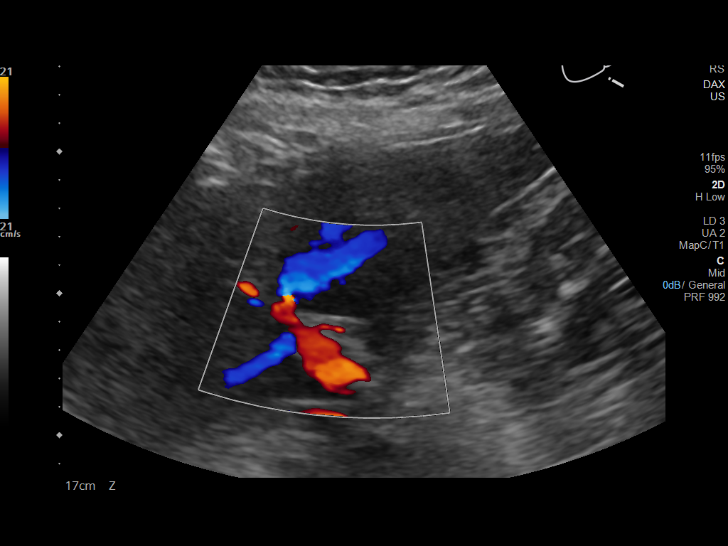
[im 28/37]
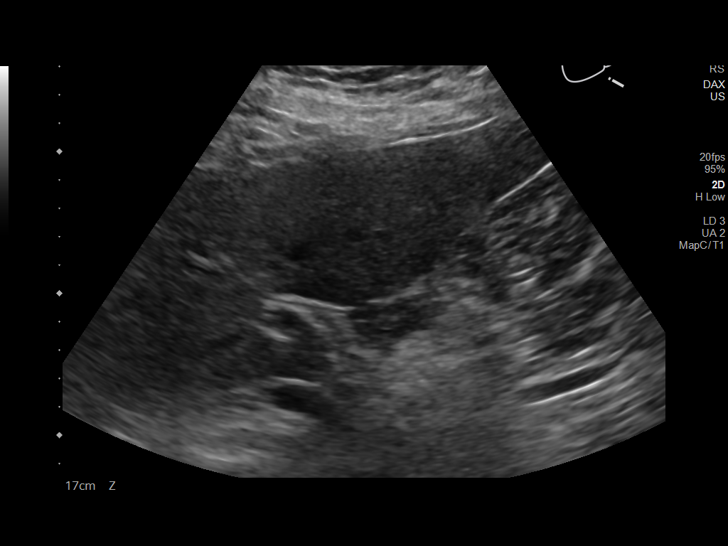
[im 31/37]
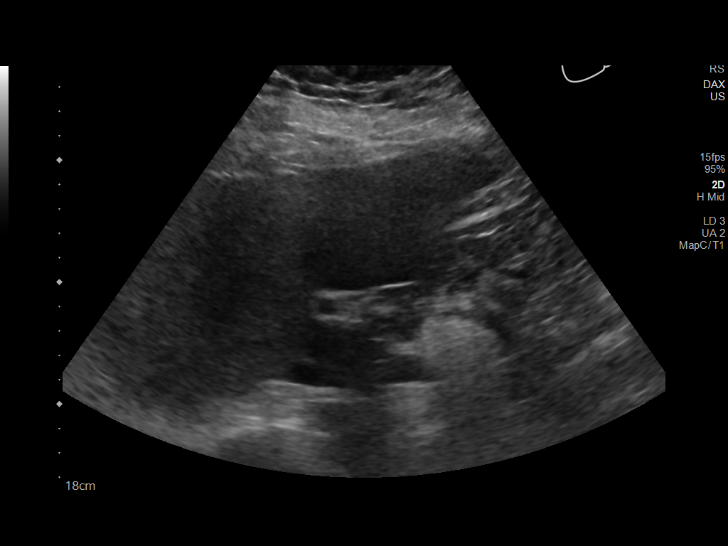
[im 34/37]
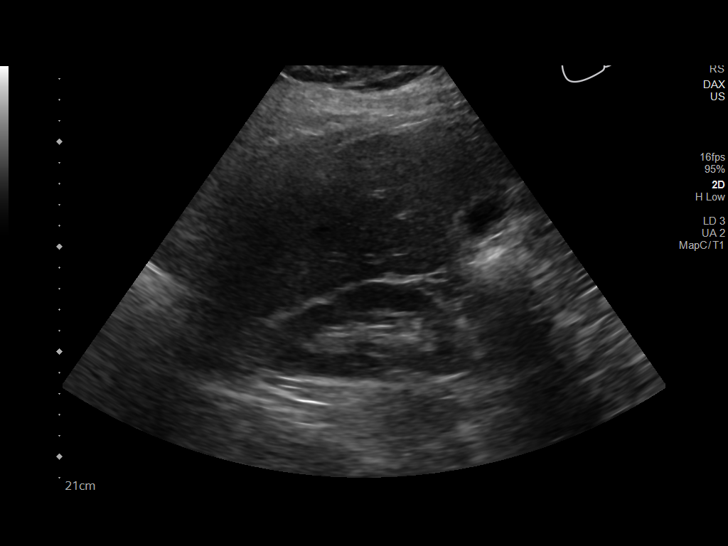
[im 37/37]
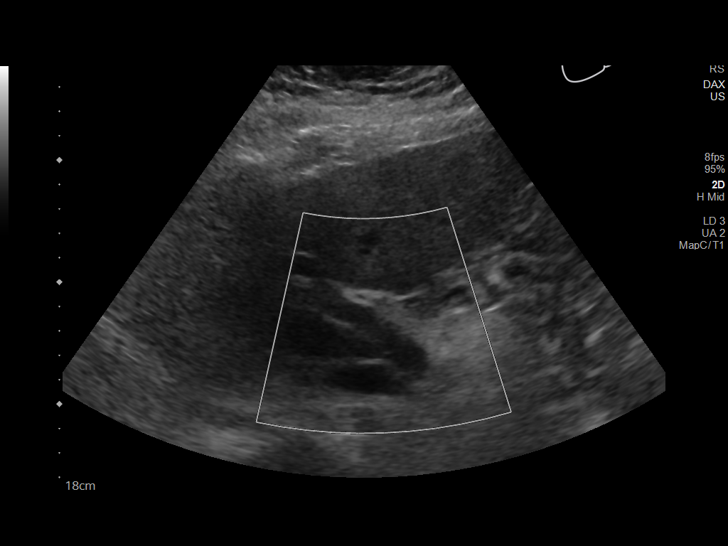

[14 of 25 positions shown; findings below may reference images not displayed]

FINDINGS: Gallbladder:

No gallstones or wall thickening visualized. No sonographic Murphy
sign noted by sonographer.

Common bile duct:

Diameter: 5 mm

Liver:

No focal lesion identified. Mildly increased hepatic parenchymal
echogenicity. Portal vein is patent on color Doppler imaging with
normal direction of blood flow towards the liver.

Other: None.
IMPRESSION: The echogenicity of the liver is mildly increased. This is a
nonspecific finding but is most commonly seen with fatty
infiltration of the liver. There are no obvious focal liver lesions.

## 2021-01-10 ENCOUNTER — Other Ambulatory Visit (INDEPENDENT_AMBULATORY_CARE_PROVIDER_SITE_OTHER): Payer: Self-pay | Admitting: Primary Care

## 2021-01-10 DIAGNOSIS — I10 Essential (primary) hypertension: Secondary | ICD-10-CM

## 2021-01-10 MED ORDER — LOSARTAN POTASSIUM-HCTZ 100-25 MG PO TABS: 1.0000 | ORAL_TABLET | Freq: Every day | ORAL | 0 refills | Status: DC

## 2021-01-10 MED ORDER — AMLODIPINE BESYLATE 10 MG PO TABS: 10.0000 mg | ORAL_TABLET | Freq: Every day | ORAL | 0 refills | Status: DC

## 2021-01-10 NOTE — Telephone Encounter (Signed)
Medication Refill - Medication: losartan-hydrochlorothiazide (HYZAAR) 100-25 MG tablet  amLODipine (NORVASC) 10 MG tablet  Has the patient contacted their pharmacy? No. (Agent: If no, request that the patient contact the pharmacy for the refill.) (Agent: If yes, when and what did the pharmacy advise?)  Preferred Pharmacy (with phone number or street name): [facility] [address] [place], [place] - [address]  [address], [place] [address]  Phone:  [phone] Fax:  [phone]   Agent: Please be advised that RX refills may take up to 3 business days. We ask that you follow-up with your pharmacy.

## 2021-01-14 ENCOUNTER — Ambulatory Visit (INDEPENDENT_AMBULATORY_CARE_PROVIDER_SITE_OTHER): Payer: BC Managed Care – PPO | Admitting: Primary Care

## 2021-01-23 ENCOUNTER — Other Ambulatory Visit (INDEPENDENT_AMBULATORY_CARE_PROVIDER_SITE_OTHER): Payer: Self-pay | Admitting: Primary Care

## 2021-01-23 DIAGNOSIS — I10 Essential (primary) hypertension: Secondary | ICD-10-CM

## 2021-01-23 MED ORDER — AMLODIPINE BESYLATE 10 MG PO TABS: 10.0000 mg | ORAL_TABLET | Freq: Every day | ORAL | 0 refills | Status: DC

## 2021-01-23 MED ORDER — LOSARTAN POTASSIUM-HCTZ 100-25 MG PO TABS: 1.0000 | ORAL_TABLET | Freq: Every day | ORAL | 0 refills | Status: DC

## 2021-01-23 NOTE — Telephone Encounter (Signed)
Copied from CRM [id number]. Topic: Quick Communication - Rx Refill/Question >> Jan 23, 2021 12:06 PM Jaquita Rector A wrote: Medication: amLODipine (NORVASC) 10 MG tablet, losartan-hydrochlorothiazide (HYZAAR) 100-25 MG tablet   Has the patient contacted their pharmacy? Yes.   (Agent: If no, request that the patient contact the pharmacy for the refill.) (Agent: If yes, when and what did the pharmacy advise?)  Preferred Pharmacy (with phone number or street name): [facility] - [place], [place] - [address]  Phone:  [phone] Fax:  [phone]     Agent: Please be advised that RX refills may take up to 3 business days. We ask that you follow-up with your pharmacy.

## 2021-01-23 NOTE — Telephone Encounter (Signed)
Requested Prescriptions  Pending Prescriptions Disp Refills  . amLODipine (NORVASC) 10 MG tablet 10 tablet 0    Sig: Take 1 tablet (10 mg total) by mouth daily.     Cardiovascular:  Calcium Channel Blockers Failed - 01/23/2021 12:12 PM      Failed - Last BP in normal range    BP Readings from Last 1 Encounters:  03/05/20 (!) 142/89         Failed - Valid encounter within last 6 months    Recent Outpatient Visits          7 months ago Wheeze   [facility] Grayce Sessions, NP   8 months ago Seasonal allergies   [facility] Grayce Sessions, NP   10 months ago Encounter for screening for HIV   [facility] Grayce Sessions, NP   11 months ago Breast cancer screening by mammogram   [facility] Grayce Sessions, NP   11 months ago TOBACCO USER   [facility] Grayce Sessions, NP      Future Appointments            In 1 week Grayce Sessions, NP [facility]           . losartan-hydrochlorothiazide (HYZAAR) 100-25 MG tablet 10 tablet 0    Sig: Take 1 tablet by mouth daily.     Cardiovascular: ARB + Diuretic Combos Failed - 01/23/2021 12:12 PM      Failed - K in normal range and within 180 days    Potassium  Date Value Ref Range Status  08/04/2019 3.8 3.5 - 5.1 mmol/L Final         Failed - Na in normal range and within 180 days    Sodium  Date Value Ref Range Status  08/04/2019 137 135 - 145 mmol/L Final         Failed - Cr in normal range and within 180 days    Creat  Date Value Ref Range Status  01/31/2014 0.89 0.50 - 1.10 mg/dL Final   Creatinine, Ser  Date Value Ref Range Status  08/04/2019 0.92 0.44 - 1.00 mg/dL Final         Failed - Ca in normal range and within 180 days    Calcium  Date Value Ref Range Status  08/04/2019 9.1 8.9 - 10.3 mg/dL Final         Failed - Last BP in normal range    BP  Readings from Last 1 Encounters:  03/05/20 (!) 142/89         Failed - Valid encounter within last 6 months    Recent Outpatient Visits          7 months ago Wheeze   [facility] Grayce Sessions, NP   8 months ago Seasonal allergies   [facility] Grayce Sessions, NP   10 months ago Encounter for screening for HIV   [facility] Grayce Sessions, NP   11 months ago Breast cancer screening by mammogram   [facility] Grayce Sessions, NP   11 months ago TOBACCO USER   [facility] Grayce Sessions, NP      Future Appointments            In 1 week Jackson,  Kinnie Scales, NP [facility]           Passed - Patient is not pregnant

## 2021-02-05 ENCOUNTER — Ambulatory Visit (INDEPENDENT_AMBULATORY_CARE_PROVIDER_SITE_OTHER): Payer: BC Managed Care – PPO | Admitting: Primary Care

## 2021-02-05 ENCOUNTER — Encounter (INDEPENDENT_AMBULATORY_CARE_PROVIDER_SITE_OTHER): Payer: Self-pay | Admitting: Primary Care

## 2021-02-05 ENCOUNTER — Other Ambulatory Visit: Payer: Self-pay

## 2021-02-05 VITALS — BP 134/89 | HR 86 | Temp 97.9°F | Ht 62.0 in | Wt 325.4 lb

## 2021-02-05 DIAGNOSIS — Z1211 Encounter for screening for malignant neoplasm of colon: Secondary | ICD-10-CM

## 2021-02-05 DIAGNOSIS — Z131 Encounter for screening for diabetes mellitus: Secondary | ICD-10-CM

## 2021-02-05 DIAGNOSIS — Z760 Encounter for issue of repeat prescription: Secondary | ICD-10-CM

## 2021-02-05 DIAGNOSIS — N924 Excessive bleeding in the premenopausal period: Secondary | ICD-10-CM

## 2021-02-05 DIAGNOSIS — Z23 Encounter for immunization: Secondary | ICD-10-CM | POA: Diagnosis not present

## 2021-02-05 DIAGNOSIS — I10 Essential (primary) hypertension: Secondary | ICD-10-CM | POA: Diagnosis not present

## 2021-02-05 LAB — POCT GLYCOSYLATED HEMOGLOBIN (HGB A1C): Hemoglobin A1C: 5.6 % (ref 4.0–5.6)

## 2021-02-05 MED ORDER — CLONIDINE HCL 0.2 MG PO TABS: 0.2000 mg | ORAL_TABLET | Freq: Every day | ORAL | 1 refills | Status: DC

## 2021-02-05 MED ORDER — AMLODIPINE BESYLATE 10 MG PO TABS: 10.0000 mg | ORAL_TABLET | Freq: Every day | ORAL | 1 refills | Status: DC

## 2021-02-05 MED ORDER — LOSARTAN POTASSIUM-HCTZ 100-25 MG PO TABS: 1.0000 | ORAL_TABLET | Freq: Every day | ORAL | 1 refills | Status: DC

## 2021-02-05 MED ORDER — METOPROLOL TARTRATE 25 MG PO TABS: 25.0000 mg | ORAL_TABLET | Freq: Two times a day (BID) | ORAL | 1 refills | Status: DC

## 2021-02-05 NOTE — Patient Instructions (Signed)
Influenza, Adult Influenza is also called "the flu." It is an infection in the lungs, nose, and throat (respiratory tract). It spreads easily from person to person (is contagious). The flu causes symptoms that are like a cold, along with high fever and body aches. What are the causes? This condition is caused by the influenza virus. You can get the virus by:  Breathing in droplets that are in the air after a person infected with the flu coughed or sneezed.  Touching something that has the virus on it and then touching your mouth, nose, or eyes. What increases the risk? Certain things may make you more likely to get the flu. These include:  Not washing your hands often.  Having close contact with many people during cold and flu season.  Touching your mouth, eyes, or nose without first washing your hands.  Not getting a flu shot every year. You may have a higher risk for the flu, and serious problems, such as a lung infection (pneumonia), if you:  Are older than 65.  Are pregnant.  Have a weakened disease-fighting system (immune system) because of a disease or because you are taking certain medicines.  Have a long-term (chronic) condition, such as: ? Heart, kidney, or lung disease. ? Diabetes. ? Asthma.  Have a liver disorder.  Are very overweight (morbidly obese).  Have anemia. What are the signs or symptoms? Symptoms usually begin suddenly and last 4-14 days. They may include:  Fever and chills.  Headaches, body aches, or muscle aches.  Sore throat.  Cough.  Runny or stuffy (congested) nose.  Feeling discomfort in your chest.  Not wanting to eat as much as normal.  Feeling weak or tired.  Feeling dizzy.  Feeling sick to your stomach or throwing up. How is this treated? If the flu is found early, you can be treated with antiviral medicine. This can help to reduce how bad the illness is and how long it lasts. This may be given by mouth or through an IV  tube. Taking care of yourself at home can help your symptoms get better. Your doctor may want you to:  Take over-the-counter medicines.  Drink plenty of fluids. The flu often goes away on its own. If you have very bad symptoms or other problems, you may be treated in a hospital. Follow these instructions at home: Activity  Rest as needed. Get plenty of sleep.  Stay home from work or school as told by your doctor. ? Do not leave home until you do not have a fever for 24 hours without taking medicine. ? Leave home only to go to your doctor. Eating and drinking  Take an ORS (oral rehydration solution). This is a drink that is sold at pharmacies and stores.  Drink enough fluid to keep your pee pale yellow.  Drink clear fluids in small amounts as you are able. Clear fluids include: ? Water. ? Ice chips. ? Fruit juice mixed with water. ? Low-calorie sports drinks.  Eat bland foods that are easy to digest. Eat small amounts as you are able. These foods include: ? Bananas. ? Applesauce. ? Rice. ? Lean meats. ? Toast. ? Crackers.  Do not eat or drink: ? Fluids that have a lot of sugar or caffeine. ? Alcohol. ? Spicy or fatty foods. General instructions  Take over-the-counter and prescription medicines only as told by your doctor.  Use a cool mist humidifier to add moisture to the air in your home. This can make it   easier for you to breathe. ? When using a cool mist humidifier, clean it daily. Empty water and replace with clean water.  Cover your mouth and nose when you cough or sneeze.  Wash your hands with soap and water often and for at least 20 seconds. This is also important after you cough or sneeze. If you cannot use soap and water, use alcohol-based hand sanitizer.  Keep all follow-up visits.      How is this prevented?  Get a flu shot every year. You may get the flu shot in late summer, fall, or winter. Ask your doctor when you should get your flu shot.  Avoid  contact with people who are sick during fall and winter. This is cold and flu season.   Contact a doctor if:  You get new symptoms.  You have: ? Chest pain. ? Watery poop (diarrhea). ? A fever.  Your cough gets worse.  You start to have more mucus.  You feel sick to your stomach.  You throw up. Get help right away if you:  Have shortness of breath.  Have trouble breathing.  Have skin or nails that turn a bluish color.  Have very bad pain or stiffness in your neck.  Get a sudden headache.  Get sudden pain in your face or ear.  Cannot eat or drink without throwing up. These symptoms may represent a serious problem that is an emergency. Get medical help right away. Call your local emergency services (911 in the [place]  Do not wait to see if the symptoms will go away.  Do not drive yourself to the hospital. Summary  Influenza is also called "the flu." It is an infection in the lungs, nose, and throat. It spreads easily from person to person.  Take over-the-counter and prescription medicines only as told by your doctor.  Getting a flu shot every year is the best way to not get the flu. This information is not intended to replace advice given to you by your health care provider. Make sure you discuss any questions you have with your health care provider. Document Revised: 07/12/2020 Document Reviewed: 07/12/2020 [employer] Patient Education  2021 [employer]  

## 2021-02-05 NOTE — Progress Notes (Addendum)
Established Patient Office Visit  Subjective:  Patient ID: [name], female    DOB: 03-Sep-1961  Age: 60 y.o. MRN: [id number]  CC:  Chief Complaint  Patient presents with  . Blood Pressure Check    HPI [name] is a 60 year old morbid obese female who presents for hypertension management. Denies shortness of breath, headaches, chest pain or lower extremity edema, sudden onset, vision changes, unilateral weakness, dizziness, paresthesias.  She voices complaints and concerns about night sweats hot flashes.  She wakes up with the back of her neck and under her breasts wet with sweat.   Past Medical History:  Diagnosis Date  . Hypertension   . Left leg DVT ([place]) 2006   s/p C-section, 1 month hospitaliation for staph infection and period of immobility  . Obesity   . Staphylococcal infection of skin 2006   1 month hospitalization, s/p C section    Past Surgical History:  Procedure Laterality Date  . ABDOMINAL EXPLORATION SURGERY  2006   staph infection s/p C-section  . CESAREAN SECTION     staph infection s/p childbirth by C-section, then Exploratory abdominal surgery done.  . TUBAL LIGATION  2006    Family History  Problem Relation Age of Onset  . Hypertension Mother   . Diabetes Mother   . Kidney disease Father        transplant, and subsequent failure   . Pneumonia Father        died of pneumonia  . Hypertension Father   . Cancer Brother        stomach  . Alcohol abuse Brother   . Ulcers Brother        bleeding ulcer related to alcohol abuse  . Heart disease Neg Hx   . Stroke Neg Hx   . Colon cancer Neg Hx     Social History   Socioeconomic History  . Marital status: Married    Spouse name: Not on file  . Number of children: Not on file  . Years of education: Not on file  . Highest education level: Not on file  Occupational History  . Not on file  Tobacco Use  . Smoking status: Current Every Day Smoker    Packs/day: 0.50    Years: 20.00     Pack years: 10.00    Types: Cigarettes  . Smokeless tobacco: Never Used  Vaping Use  . Vaping Use: Never used  Substance and Sexual Activity  . Alcohol use: No  . Drug use: No  . Sexual activity: Yes  Other Topics Concern  . Not on file  Social History Narrative   Married, has 3 children, works for [employer], no exercise   Social Determinants of Health   Financial Resource Strain: Not on file  Food Insecurity: Not on file  Transportation Needs: Not on file  Physical Activity: Not on file  Stress: Not on file  Social Connections: Not on file  Intimate Partner Violence: Not on file    Outpatient Medications Prior to Visit  Medication Sig Dispense Refill  . albuterol (VENTOLIN HFA) 108 (90 Base) MCG/ACT inhaler Inhale 2 puffs into the lungs every 6 (six) hours as needed for wheezing or shortness of breath. 18 g 1  . Blood Pressure Monitoring KIT 1 Bag by Does not apply route 3 (three) times daily. 1 kit 0  . dextromethorphan-guaiFENesin (MUCINEX DM) 30-600 MG 12hr tablet Take 1 tablet by mouth 2 (two) times daily. 60 tablet 1  .  ferrous fumarate (HEMOCYTE - 106 MG FE) 325 (106 FE) MG TABS tablet Take 1 tablet by mouth.    . fluticasone (FLONASE) 50 MCG/ACT nasal spray Place 2 sprays into both nostrils daily. 16 g 6  . Fluticasone-Salmeterol (ADVAIR) 100-50 MCG/DOSE AEPB Inhale 1 puff into the lungs 2 (two) times daily. 1 each 3  . ibuprofen (ADVIL,MOTRIN) 200 MG tablet Take 600 mg by mouth every 6 (six) hours as needed for moderate pain (Pain).     Marland Kitchen amLODipine (NORVASC) 10 MG tablet Take 1 tablet (10 mg total) by mouth daily. 15 tablet 0  . cloNIDine (CATAPRES) 0.2 MG tablet Take 1 tablet (0.2 mg total) by mouth daily. 90 tablet 1  . losartan-hydrochlorothiazide (HYZAAR) 100-25 MG tablet Take 1 tablet by mouth daily. 15 tablet 0  . metoprolol tartrate (LOPRESSOR) 25 MG tablet Take 1 tablet (25 mg total) by mouth 2 (two) times daily. 180 tablet 1  .  levocetirizine (XYZAL) 5 MG tablet Take 1 tablet (5 mg total) by mouth every evening. 90 tablet 1   No facility-administered medications prior to visit.    No Known Allergies  ROS Review of Systems  Endocrine:       Menopausal with hot flashes  All other systems reviewed and are negative.     Objective:    Physical Exam Vitals reviewed.  Constitutional:      Appearance: She is obese.  HENT:     Head: Normocephalic.     Right Ear: External ear normal.     Left Ear: External ear normal.     Nose: Nose normal.  Cardiovascular:     Rate and Rhythm: Normal rate and regular rhythm.  Pulmonary:     Effort: Pulmonary effort is normal.     Breath sounds: Wheezing present.  Abdominal:     General: Bowel sounds are normal. There is distension.     Palpations: Abdomen is soft.  Musculoskeletal:        General: Normal range of motion.     Cervical back: Normal range of motion and neck supple.  Skin:    General: Skin is warm and dry.  Neurological:     Mental Status: She is alert and oriented to person, place, and time.  Psychiatric:        Mood and Affect: Mood normal.        Behavior: Behavior normal.        Thought Content: Thought content normal.        Judgment: Judgment normal.     BP 134/89 (BP Location: Right Wrist, Patient Position: Sitting, Cuff Size: Normal)   Pulse 86   Temp 97.9 F (36.6 C) (Temporal)   Ht '5\' 2"'  (1.575 m)   Wt (!) 325 lb 6.4 oz (147.6 kg)   LMP 07/19/2019 (Approximate)   SpO2 98%   BMI 59.52 kg/m  Wt Readings from Last 3 Encounters:  02/05/21 (!) 325 lb 6.4 oz (147.6 kg)  03/05/20 (!) 329 lb 3.2 oz (149.3 kg)  02/21/20 (!) 328 lb 9.6 oz (149.1 kg)     Health Maintenance Due  Topic Date Due  . COVID-19 Vaccine (1) Never done  . COLONOSCOPY (Pts 45-2yr Insurance coverage will need to be confirmed)  Never done    There are no preventive care reminders to display for this patient.  Lab Results  Component Value Date   TSH 1.238  01/31/2014   Lab Results  Component Value Date   WBC 9.7 02/05/2021  HGB 13.5 02/05/2021   HCT 41.2 02/05/2021   MCV 77 (L) 02/05/2021   PLT 249 02/05/2021   Lab Results  Component Value Date   NA 140 02/05/2021   K 3.5 02/05/2021   CO2 24 02/05/2021   GLUCOSE 96 02/05/2021   BUN 8 02/05/2021   CREATININE 0.96 02/05/2021   BILITOT 0.3 02/05/2021   ALKPHOS 91 02/05/2021   AST 19 02/05/2021   ALT 15 02/05/2021   PROT 7.4 02/05/2021   ALBUMIN 4.2 02/05/2021   CALCIUM 10.0 02/05/2021   ANIONGAP 11 08/04/2019   Lab Results  Component Value Date   CHOL 215 (H) 02/05/2021   Lab Results  Component Value Date   HDL 57 02/05/2021   Lab Results  Component Value Date   LDLCALC 131 (H) 02/05/2021   Lab Results  Component Value Date   TRIG 151 (H) 02/05/2021   Lab Results  Component Value Date   CHOLHDL 3.8 02/05/2021   Lab Results  Component Value Date   HGBA1C 5.6 02/05/2021      Assessment & Plan:  Teigen was seen today for blood pressure check.  Diagnoses and all orders for this visit:  Screening for diabetes mellitus -     HgB A1c  Need for immunization against influenza -     Flu Vaccine QUAD 36+ mos IM  HYPERTENSION, BENIGN Counseled on blood pressure goal of less than 130/80, low-sodium, DASH diet, medication compliance, 150 minutes of moderate intensity exercise per week. Discussed medication compliance, adverse effects. -     losartan-hydrochlorothiazide (HYZAAR) 100-25 MG tablet; Take 1 tablet by mouth daily. -     amLODipine (NORVASC) 10 MG tablet; Take 1 tablet (10 mg total) by mouth daily. -     cloNIDine (CATAPRES) 0.2 MG tablet; Take 1 tablet (0.2 mg total) by mouth daily. -     CBC with Differential -     CMP14+EGFR  Medication refill -     losartan-hydrochlorothiazide (HYZAAR) 100-25 MG tablet; Take 1 tablet by mouth daily. -     amLODipine (NORVASC) 10 MG tablet; Take 1 tablet (10 mg total) by mouth daily. -     metoprolol tartrate  (LOPRESSOR) 25 MG tablet; Take 1 tablet (25 mg total) by mouth 2 (two) times daily. -     cloNIDine (CATAPRES) 0.2 MG tablet; Take 1 tablet (0.2 mg total) by mouth daily.  Premenopausal menorrhagia Discussed natural methods for hot flashes such as black cohosh and vitamin E 400 mg x 2 daily -     CBC with Differential  Morbid obesity ([place]) Morbid obesity is greater than 40 BMI indicating an excess in caloric intake or underlining conditions. This may lead to other co-morbidities.  Problems can include diabetes, respiratory problems lifestyle modifications of diet and exercise may reduce obesity.  -     Lipid Panel    Meds ordered this encounter  Medications  . losartan-hydrochlorothiazide (HYZAAR) 100-25 MG tablet    Sig: Take 1 tablet by mouth daily.    Dispense:  90 tablet    Refill:  1    Courtesy refill to cover pt until 02/05/21 appt; visit needed for additional refills  . amLODipine (NORVASC) 10 MG tablet    Sig: Take 1 tablet (10 mg total) by mouth daily.    Dispense:  90 tablet    Refill:  1    Courtesy refill to cover pt until 02/05/21 appt; visit needed for additional refills  . metoprolol tartrate (LOPRESSOR) 25  MG tablet    Sig: Take 1 tablet (25 mg total) by mouth 2 (two) times daily.    Dispense:  180 tablet    Refill:  1  . cloNIDine (CATAPRES) 0.2 MG tablet    Sig: Take 1 tablet (0.2 mg total) by mouth daily.    Dispense:  90 tablet    Refill:  1    Follow-up: Return in about 6 months (around 08/08/2021) for HTN /in person .    Kerin Perna, NP

## 2021-02-06 LAB — CMP14+EGFR
ALT: 15 IU/L (ref 0–32)
AST: 19 IU/L (ref 0–40)
Albumin/Globulin Ratio: 1.3 (ref 1.2–2.2)
Albumin: 4.2 g/dL (ref 3.8–4.9)
Alkaline Phosphatase: 91 IU/L (ref 44–121)
BUN/Creatinine Ratio: 8 — ABNORMAL LOW (ref 9–23)
BUN: 8 mg/dL (ref 6–24)
Bilirubin Total: 0.3 mg/dL (ref 0.0–1.2)
CO2: 24 mmol/L (ref 20–29)
Calcium: 10 mg/dL (ref 8.7–10.2)
Chloride: 99 mmol/L (ref 96–106)
Creatinine, Ser: 0.96 mg/dL (ref 0.57–1.00)
Globulin, Total: 3.2 g/dL (ref 1.5–4.5)
Glucose: 96 mg/dL (ref 65–99)
Potassium: 3.5 mmol/L (ref 3.5–5.2)
Sodium: 140 mmol/L (ref 134–144)
Total Protein: 7.4 g/dL (ref 6.0–8.5)
eGFR: 69 mL/min/{1.73_m2} (ref >59)

## 2021-02-06 LAB — CBC WITH DIFFERENTIAL/PLATELET
Basophils Absolute: 0 10*3/uL (ref 0.0–0.2)
Basos: 0 %
EOS (ABSOLUTE): 0.1 10*3/uL (ref 0.0–0.4)
Eos: 1 %
Hematocrit: 41.2 % (ref 34.0–46.6)
Hemoglobin: 13.5 g/dL (ref 11.1–15.9)
Immature Grans (Abs): 0 10*3/uL (ref 0.0–0.1)
Immature Granulocytes: 0 %
Lymphocytes Absolute: 3.5 10*3/uL — ABNORMAL HIGH (ref 0.7–3.1)
Lymphs: 36 %
MCH: 25.2 pg — ABNORMAL LOW (ref 26.6–33.0)
MCHC: 32.8 g/dL (ref 31.5–35.7)
MCV: 77 fL — ABNORMAL LOW (ref 79–97)
Monocytes Absolute: 0.8 10*3/uL (ref 0.1–0.9)
Monocytes: 9 %
Neutrophils Absolute: 5.2 10*3/uL (ref 1.4–7.0)
Neutrophils: 54 %
Platelets: 249 10*3/uL (ref 150–450)
RBC: 5.35 x10E6/uL — ABNORMAL HIGH (ref 3.77–5.28)
RDW: 15.7 % — ABNORMAL HIGH (ref 11.7–15.4)
WBC: 9.7 10*3/uL (ref 3.4–10.8)

## 2021-02-06 LAB — LIPID PANEL
Chol/HDL Ratio: 3.8 ratio (ref 0.0–4.4)
Cholesterol, Total: 215 mg/dL — ABNORMAL HIGH (ref 100–199)
HDL: 57 mg/dL (ref >39)
LDL Chol Calc (NIH): 131 mg/dL — ABNORMAL HIGH (ref 0–99)
Triglycerides: 151 mg/dL — ABNORMAL HIGH (ref 0–149)
VLDL Cholesterol Cal: 27 mg/dL (ref 5–40)

## 2021-02-11 ENCOUNTER — Other Ambulatory Visit (INDEPENDENT_AMBULATORY_CARE_PROVIDER_SITE_OTHER): Payer: Self-pay | Admitting: Primary Care

## 2021-02-11 DIAGNOSIS — E782 Mixed hyperlipidemia: Secondary | ICD-10-CM

## 2021-02-11 MED ORDER — ATORVASTATIN CALCIUM 40 MG PO TABS: 40.0000 mg | ORAL_TABLET | Freq: Every day | ORAL | 3 refills | Status: DC

## 2021-04-02 DIAGNOSIS — U071 COVID-19: Secondary | ICD-10-CM | POA: Diagnosis not present

## 2021-08-12 ENCOUNTER — Ambulatory Visit (INDEPENDENT_AMBULATORY_CARE_PROVIDER_SITE_OTHER): Payer: BC Managed Care – PPO | Admitting: Primary Care

## 2021-08-27 ENCOUNTER — Other Ambulatory Visit (INDEPENDENT_AMBULATORY_CARE_PROVIDER_SITE_OTHER): Payer: Self-pay | Admitting: Primary Care

## 2021-08-27 DIAGNOSIS — I10 Essential (primary) hypertension: Secondary | ICD-10-CM

## 2021-08-27 DIAGNOSIS — Z760 Encounter for issue of repeat prescription: Secondary | ICD-10-CM

## 2021-08-27 NOTE — Telephone Encounter (Signed)
Sent to PCP ?

## 2021-08-31 ENCOUNTER — Other Ambulatory Visit (INDEPENDENT_AMBULATORY_CARE_PROVIDER_SITE_OTHER): Payer: Self-pay | Admitting: Primary Care

## 2021-09-01 ENCOUNTER — Other Ambulatory Visit (INDEPENDENT_AMBULATORY_CARE_PROVIDER_SITE_OTHER): Payer: Self-pay | Admitting: Primary Care

## 2021-09-01 ENCOUNTER — Telehealth (INDEPENDENT_AMBULATORY_CARE_PROVIDER_SITE_OTHER): Payer: Self-pay | Admitting: Primary Care

## 2021-09-01 DIAGNOSIS — Z760 Encounter for issue of repeat prescription: Secondary | ICD-10-CM

## 2021-09-01 DIAGNOSIS — I10 Essential (primary) hypertension: Secondary | ICD-10-CM

## 2021-09-01 NOTE — Telephone Encounter (Signed)
Sent to pcp

## 2021-09-01 NOTE — Telephone Encounter (Signed)
Patient called, left VM to return the call to the office re: refills requested Rosuvastatin and Ventolin Inhaler. Both sent to [facility] on 08/31/21. [facility] called and spoke to [place], Pensions consultant about the refill(s) requested. Advised both were sent on 08/31/21. She says both are ready for pickup by the patient. Patient will need to be notified if she returns the call back.

## 2021-09-01 NOTE — Telephone Encounter (Signed)
Copied from CRM [id number]. Topic: Quick Communication - Rx Refill/Question >> Sep 01, 2021  3:44 PM Gaetana Michaelis A wrote: Medication: losartan-hydrochlorothiazide (HYZAAR) 100-25 MG tablet [[id number]]   VENTOLIN HFA 108 (90 Base) MCG/ACT inhaler [[id number]]    Has the patient contacted their pharmacy? Yes.   (Agent: If no, request that the patient contact the pharmacy for the refill.) (Agent: If yes, when and what did the pharmacy advise?)  Preferred Pharmacy (with phone number or street name): [id number] - [place], [place] - [address]  Phone:  [phone] Fax:  [phone]  Has the patient been seen for an appointment in the last year OR does the patient have an upcoming appointment? Yes.    Agent: Please be advised that RX refills may take up to 3 business days. We ask that you follow-up with your pharmacy.

## 2021-09-17 ENCOUNTER — Ambulatory Visit (INDEPENDENT_AMBULATORY_CARE_PROVIDER_SITE_OTHER): Payer: BC Managed Care – PPO | Admitting: Primary Care

## 2021-09-17 ENCOUNTER — Encounter (INDEPENDENT_AMBULATORY_CARE_PROVIDER_SITE_OTHER): Payer: Self-pay | Admitting: Primary Care

## 2021-09-17 ENCOUNTER — Other Ambulatory Visit: Payer: Self-pay

## 2021-09-17 VITALS — BP 148/86 | HR 88 | Temp 97.5°F | Ht 62.0 in | Wt 319.4 lb

## 2021-09-17 DIAGNOSIS — F17210 Nicotine dependence, cigarettes, uncomplicated: Secondary | ICD-10-CM | POA: Diagnosis not present

## 2021-09-17 DIAGNOSIS — I10 Essential (primary) hypertension: Secondary | ICD-10-CM | POA: Diagnosis not present

## 2021-09-17 DIAGNOSIS — Z23 Encounter for immunization: Secondary | ICD-10-CM

## 2021-09-17 DIAGNOSIS — Z1211 Encounter for screening for malignant neoplasm of colon: Secondary | ICD-10-CM

## 2021-09-17 DIAGNOSIS — Z6841 Body Mass Index (BMI) 40.0 and over, adult: Secondary | ICD-10-CM

## 2021-09-17 DIAGNOSIS — J4520 Mild intermittent asthma, uncomplicated: Secondary | ICD-10-CM

## 2021-09-17 DIAGNOSIS — F17200 Nicotine dependence, unspecified, uncomplicated: Secondary | ICD-10-CM

## 2021-09-17 DIAGNOSIS — Z760 Encounter for issue of repeat prescription: Secondary | ICD-10-CM

## 2021-09-17 MED ORDER — METOPROLOL TARTRATE 25 MG PO TABS: 25.0000 mg | ORAL_TABLET | Freq: Two times a day (BID) | ORAL | 1 refills | Status: DC

## 2021-09-17 MED ORDER — VENTOLIN HFA 108 (90 BASE) MCG/ACT IN AERS: INHALATION_SPRAY | RESPIRATORY_TRACT | 1 refills | Status: DC

## 2021-09-17 MED ORDER — CLONIDINE HCL 0.2 MG PO TABS: 0.2000 mg | ORAL_TABLET | Freq: Every day | ORAL | 1 refills | Status: DC

## 2021-09-17 MED ORDER — LOSARTAN POTASSIUM-HCTZ 100-25 MG PO TABS: 1.0000 | ORAL_TABLET | Freq: Every day | ORAL | 1 refills | Status: DC

## 2021-09-17 MED ORDER — AMLODIPINE BESYLATE 10 MG PO TABS: 10.0000 mg | ORAL_TABLET | Freq: Every day | ORAL | 1 refills | Status: DC

## 2021-09-17 MED ORDER — SYMBICORT 80-4.5 MCG/ACT IN AERO: 2.0000 | INHALATION_SPRAY | Freq: Two times a day (BID) | RESPIRATORY_TRACT | 6 refills | Status: DC

## 2021-09-17 NOTE — Patient Instructions (Addendum)
Influenza, Adult Influenza is also called "the flu." It is an infection in the lungs, nose, and throat (respiratory tract). It spreads easily from person to person (is contagious). The flu causes symptoms that are like a cold, along with high fever and body aches. What are the causes? This condition is caused by the influenza virus. You can get the virus by: Breathing in droplets that are in the air after a person infected with the flu coughed or sneezed. Touching something that has the virus on it and then touching your mouth, nose, or eyes. What increases the risk? Certain things may make you more likely to get the flu. These include: Not washing your hands often. Having close contact with many people during cold and flu season. Touching your mouth, eyes, or nose without first washing your hands. Not getting a flu shot every year. You may have a higher risk for the flu, and serious problems, such as a lung infection (pneumonia), if you: Are older than 65. Are pregnant. Have a weakened disease-fighting system (immune system) because of a disease or because you are taking certain medicines. Have a long-term (chronic) condition, such as: Heart, kidney, or lung disease. Diabetes. Asthma. Have a liver disorder. Are very overweight (morbidly obese). Have anemia. What are the signs or symptoms? Symptoms usually begin suddenly and last 4-14 days. They may include: Fever and chills. Headaches, body aches, or muscle aches. Sore throat. Cough. Runny or stuffy (congested) nose. Feeling discomfort in your chest. Not wanting to eat as much as normal. Feeling weak or tired. Feeling dizzy. Feeling sick to your stomach or throwing up. How is this treated? If the flu is found early, you can be treated with antiviral medicine. This can help to reduce how bad the illness is and how long it lasts. This may be given by mouth or through an IV tube. Taking care of yourself at home can help your  symptoms get better. Your doctor may want you to: Take over-the-counter medicines. Drink plenty of fluids. The flu often goes away on its own. If you have very bad symptoms or other problems, you may be treated in a hospital. Follow these instructions at home:   Activity Rest as needed. Get plenty of sleep. Stay home from work or school as told by your doctor. Do not leave home until you do not have a fever for 24 hours without taking medicine. Leave home only to go to your doctor. Eating and drinking Take an ORS (oral rehydration solution). This is a drink that is sold at pharmacies and stores. Drink enough fluid to keep your pee pale yellow. Drink clear fluids in small amounts as you are able. Clear fluids include: Water. Ice chips. Fruit juice mixed with water. Low-calorie sports drinks. Eat bland foods that are easy to digest. Eat small amounts as you are able. These foods include: Bananas. Applesauce. Rice. Lean meats. Toast. Crackers. Do not eat or drink: Fluids that have a lot of sugar or caffeine. Alcohol. Spicy or fatty foods. General instructions Take over-the-counter and prescription medicines only as told by your doctor. Use a cool mist humidifier to add moisture to the air in your home. This can make it easier for you to breathe. When using a cool mist humidifier, clean it daily. Empty water and replace with clean water. Cover your mouth and nose when you cough or sneeze. Wash your hands with soap and water often and for at least 20 seconds. This is also important after  you cough or sneeze. If you cannot use soap and water, use alcohol-based hand sanitizer. Keep all follow-up visits. How is this prevented?  Get a flu shot every year. You may get the flu shot in late summer, fall, or winter. Ask your doctor when you should get your flu shot. Avoid contact with people who are sick during fall and winter. This is cold and flu season. Contact a doctor if: You get  new symptoms. You have: Chest pain. Watery poop (diarrhea). A fever. Your cough gets worse. You start to have more mucus. You feel sick to your stomach. You throw up. Get help right away if you: Have shortness of breath. Have trouble breathing. Have skin or nails that turn a bluish color. Have very bad pain or stiffness in your neck. Get a sudden headache. Get sudden pain in your face or ear. Cannot eat or drink without throwing up. These symptoms may represent a serious problem that is an emergency. Get medical help right away. Call your local emergency services (911 in the [place] Do not wait to see if the symptoms will go away. Do not drive yourself to the hospital. Summary Influenza is also called "the flu." It is an infection in the lungs, nose, and throat. It spreads easily from person to person. Take over-the-counter and prescription medicines only as told by your doctor. Getting a flu shot every year is the best way to not get the flu. This information is not intended to replace advice given to you by your health care provider. Make sure you discuss any questions you have with your health care provider. Document Revised: 07/12/2020 Document Reviewed: 07/12/2020 Elsevier Patient Education  2022 [employer] Shingles Shingles is an infection. It gives you a painful skin rash and blisters that have fluid in them. Shingles is caused by the same germ (virus) that causes chickenpox. Shingles only happens in people who: Have had chickenpox. Have been given a shot (vaccine) to protect against chickenpox. Shingles is rare in this group. What are the causes? This condition is caused by varicella-zoster virus. This is the same germ that causes chickenpox. After a person is exposed to the germ, the germ stays in the body but is not active (dormant). Shingles develops if the germ becomes active again (is reactivated). This can happen many years after the first exposure to the germ. It  is not known what causes this germ to become active again. What increases the risk? People who have had chickenpox or received the chickenpox shot are at risk for shingles. This infection is more common in people who: Are older than 58 years of age. Have a weakened disease-fighting system (immune system), such as people with: HIV (human immunodeficiency virus). AIDS (acquired immunodeficiency syndrome). Cancer. Are taking medicines that weaken the immune system, such as organ transplant medicines. Have a lot of stress. What are the signs or symptoms? The first symptoms of shingles may be itching, tingling, or pain in an area on your skin. A rash will show on your skin a few days or weeks later. This is what usually happens: The rash is likely to be on one side of your body. The rash usually has a shape like a belt or a band. Over time, the rash turns into fluid-filled blisters. The blisters will break open and change into scabs. The scabs usually dry up in about 2-3 weeks. You may also have: A fever. Chills. A headache. A feeling like you may vomit (nausea). How is  this treated? The rash may last for several weeks. There is not a specific cure for this condition. Your doctor may prescribe medicines. Medicines may: Help with pain. Help you get better sooner. Help to prevent long-term problems. Help with itching (antihistamines). If the area involved is on your face, you may need to see a specialist. This may be an eye doctor or an ear, nose, and throat (ENT) doctor. Follow these instructions at home: Medicines Take over-the-counter and prescription medicines only as told by your doctor. Put on an anti-itch cream or numbing cream where you have a rash, blisters, or scabs. Do this as told by your doctor. Helping with itching and discomfort  Put cold, wet cloths (cold compresses) on the area of the rash or blisters as told by your doctor. Cool baths can help you feel better. Try  adding baking soda or dry oatmeal to the water to lessen itching. Do not bathe in hot water. Use calamine lotion as told by your doctor. Blister and rash care Keep your rash covered with a loose bandage (dressing). Wear loose clothing that does not rub on your rash. Wash your hands with soap and water for at least 20 seconds before and after you change your bandage. If you cannot use soap and water, use hand sanitizer. Change your bandage as told by your doctor. Keep your rash and blisters clean. To do this, wash the area with mild soap and cool water as told by your doctor. Check your rash every day for signs of infection. Check for: More redness, swelling, or pain. Fluid or blood. Warmth. Pus or a bad smell. Do not scratch your rash. Do not pick at your blisters. To help you to not scratch: Keep your fingernails clean and cut short. Wear gloves or mittens when you sleep, if scratching is a problem. General instructions Rest as told by your doctor. Wash your hands often with soap and water for at least 20 seconds. If you cannot use soap and water, use hand sanitizer. Doing this lowers your chance of getting a skin infection. Your infection can cause chickenpox in people who have never had chickenpox or never got a chickenpox vaccine shot. If you have blisters that did not change into scabs yet, try not to touch other people or be around other people, especially: Babies. Pregnant women. Children who have areas of red, itchy, or rough skin (eczema). Older people who have organ transplants. People who have a long-term (chronic) illness, like cancer or AIDS. Keep all follow-up visits. How is this prevented? A vaccine shot is the best way to prevent shingles and protect against shingles problems. If you have not had a vaccine shot, talk with your doctor about getting it. Where to find more information Centers for Disease Control and Prevention: [employer] Contact a doctor if: Your pain  does not get better with medicine. Your pain does not get better after the rash heals. You have any of these signs of infection around the rash: More redness, swelling, or pain. Fluid or blood. Warmth. Pus or a bad smell. You have a fever. Get help right away if: The rash is on your face or nose. You have pain in your face or pain by your eye. You lose feeling on one side of your face. You have trouble seeing. You have ear pain, or you have ringing in your ear. You have a loss of taste. Your condition gets worse. Summary Shingles gives you a painful skin rash and blisters that  have fluid in them. Shingles is caused by the same germ (virus) that causes chickenpox. Keep your rash covered with a loose bandage. Wear loose clothing that does not rub on your rash. If you have blisters that did not change into scabs yet, try not to touch other people or be around people. This information is not intended to replace advice given to you by your health care provider. Make sure you discuss any questions you have with your health care provider. Document Revised: 11/18/2020 Document Reviewed: 11/18/2020 Elsevier Patient Education  2022 [employer] Semaglutide Injection What is this medication? SEMAGLUTIDE (SEM a GLOO tide) treats type 2 diabetes. It works by increasing insulin levels in your body, which decreases your blood sugar (glucose). It also reduces the amount of sugar released into the blood and slows down your digestion. It can also be used to lower the risk of heart attack and stroke in people with type 2 diabetes. Changes to diet and exercise are often combined with this medication. This medicine may be used for other purposes; ask your health care provider or pharmacist if you have questions. COMMON BRAND NAME(S): OZEMPIC What should I tell my care team before I take this medication? They need to know if you have any of these conditions: Endocrine tumors (MEN 2) or if someone in your  family had these tumors Eye disease, vision problems History of pancreatitis Kidney disease Stomach problems Thyroid cancer or if someone in your family had thyroid cancer An unusual or allergic reaction to semaglutide, other medications, foods, dyes, or preservatives Pregnant or trying to get pregnant Breast-feeding How should I use this medication? This medication is for injection under the skin of your upper leg (thigh), stomach area, or upper arm. It is given once every week (every 7 days). You will be taught how to prepare and give this medication. Use exactly as directed. Take your medication at regular intervals. Do not take it more often than directed. If you use this medication with insulin, you should inject this medication and the insulin separately. Do not mix them together. Do not give the injections right next to each other. Change (rotate) injection sites with each injection. It is important that you put your used needles and syringes in a special sharps container. Do not put them in a trash can. If you do not have a sharps container, call your pharmacist or care team to get one. A special MedGuide will be given to you by the pharmacist with each prescription and refill. Be sure to read this information carefully each time. This medication comes with INSTRUCTIONS FOR USE. Ask your pharmacist for directions on how to use this medication. Read the information carefully. Talk to your pharmacist or care team if you have questions. Talk to your care team about the use of this medication in children. Special care may be needed. Overdosage: If you think you have taken too much of this medicine contact a poison control center or emergency room at once. NOTE: This medicine is only for you. Do not share this medicine with others. What if I miss a dose? If you miss a dose, take it as soon as you can within 5 days after the missed dose. Then take your next dose at your regular weekly time. If  it has been longer than 5 days after the missed dose, do not take the missed dose. Take the next dose at your regular time. Do not take double or extra doses. If you have  questions about a missed dose, contact your care team for advice. What may interact with this medication? Other medications for diabetes Many medications may cause changes in blood sugar, these include: Alcohol containing beverages Antiviral medications for HIV or AIDS Aspirin and aspirin-like medications Certain medications for blood pressure, heart disease, irregular heart beat Chromium Diuretics Female hormones, such as estrogens or progestins, birth control pills Fenofibrate Gemfibrozil Isoniazid Lanreotide Female hormones or anabolic steroids MAOIs like Carbex, Eldepryl, Marplan, Nardil, and Parnate Medications for weight loss Medications for allergies, asthma, cold, or cough Medications for depression, anxiety, or psychotic disturbances Niacin Nicotine NSAIDs, medications for pain and inflammation, like ibuprofen or naproxen Octreotide Pasireotide Pentamidine Phenytoin Probenecid Quinolone antibiotics such as ciprofloxacin, levofloxacin, ofloxacin Some herbal dietary supplements Steroid medications such as prednisone or cortisone Sulfamethoxazole; trimethoprim Thyroid hormones Some medications can hide the warning symptoms of low blood sugar (hypoglycemia). You may need to monitor your blood sugar more closely if you are taking one of these medications. These include: Beta-blockers, often used for high blood pressure or heart problems (examples include atenolol, metoprolol, propranolol) Clonidine Guanethidine Reserpine This list may not describe all possible interactions. Give your health care provider a list of all the medicines, herbs, non-prescription drugs, or dietary supplements you use. Also tell them if you smoke, drink alcohol, or use illegal drugs. Some items may interact with your medicine. What  should I watch for while using this medication? Visit your care team for regular checks on your progress. Drink plenty of fluids while taking this medication. Check with your care team if you get an attack of severe diarrhea, nausea, and vomiting. The loss of too much body fluid can make it dangerous for you to take this medication. A test called the HbA1C (A1C) will be monitored. This is a simple blood test. It measures your blood sugar control over the last 2 to 3 months. You will receive this test every 3 to 6 months. Learn how to check your blood sugar. Learn the symptoms of low and high blood sugar and how to manage them. Always carry a quick-source of sugar with you in case you have symptoms of low blood sugar. Examples include hard sugar candy or glucose tablets. Make sure others know that you can choke if you eat or drink when you develop serious symptoms of low blood sugar, such as seizures or unconsciousness. They must get medical help at once. Tell your care team if you have high blood sugar. You might need to change the dose of your medication. If you are sick or exercising more than usual, you might need to change the dose of your medication. Do not skip meals. Ask your care team if you should avoid alcohol. Many nonprescription cough and cold products contain sugar or alcohol. These can affect blood sugar. Pens should never be shared. Even if the needle is changed, sharing may result in passing of viruses like hepatitis or HIV. Wear a medical ID bracelet or chain, and carry a card that describes your disease and details of your medication and dosage times. Do not become pregnant while taking this medication. Women should inform their care team if they wish to become pregnant or think they might be pregnant. There is a potential for serious side effects to an unborn child. Talk to your care team for more information. What side effects may I notice from receiving this medication? Side effects  that you should report to your care team as soon as possible: Allergic reactions-skin  rash, itching, hives, swelling of the face, lips, tongue, or throat Change in vision Dehydration-increased thirst, dry mouth, feeling faint or lightheaded, headache, dark yellow or brown urine Gallbladder problems-severe stomach pain, nausea, vomiting, fever Heart palpitations-rapid, pounding, or irregular heartbeat Kidney injury-decrease in the amount of urine, swelling of the ankles, hands, or feet Pancreatitis-severe stomach pain that spreads to your back or gets worse after eating or when touched, fever, nausea, vomiting Thyroid cancer-new mass or lump in the neck, pain or trouble swallowing, trouble breathing, hoarseness Side effects that usually do not require medical attention (report to your care team if they continue or are bothersome): Diarrhea Loss of appetite Nausea Stomach pain Vomiting This list may not describe all possible side effects. Call your doctor for medical advice about side effects. You may report side effects to FDA at 1-800-FDA-1088. Where should I keep my medication? Keep out of the reach of children. Store unopened pens in a refrigerator between 2 and 8 degrees C (36 and 46 degrees F). Do not freeze. Protect from light and heat. After you first use the pen, it can be stored for 56 days at room temperature between 15 and 30 degrees C (59 and 86 degrees F) or in a refrigerator. Throw away your used pen after 56 days or after the expiration date, whichever comes first. Do not store your pen with the needle attached. If the needle is left on, medication may leak from the pen. NOTE: This sheet is a summary. It may not cover all possible information. If you have questions about this medicine, talk to your doctor, pharmacist, or health care provider.  2022 Elsevier/[employer] Standard (2021-01-20 16:01:53) Saxagliptin oral tablets What is this medication? SAXAGLIPTIN (SAX a glip tin) helps  to treat type 2 diabetes. It helps to control blood sugar. Treatment is combined with diet and exercise. This medicine may be used for other purposes; ask your health care provider or pharmacist if you have questions. COMMON BRAND NAME(S): Onglyza What should I tell my care team before I take this medication? They need to know if you have any of these conditions: diabetic ketoacidosis heart disease heart failure kidney disease pancreatitis previous swelling of the tongue, face, or lips with difficulty breathing, difficulty swallowing, hoarseness, or tightening of the throat type 1 diabetes an unusual or allergic reaction to saxagliptin, other medicines, foods, dyes, or preservatives pregnant or trying to get pregnant breast-feeding How should I use this medication? Take this medicine by mouth with a glass of water. Follow the directions on the prescription label. You can take it with or without food. Do not cut, crush, or chew this medicine. Take your dose at the same time each day. Do not take more often than directed. Do not stop taking except on your doctor's advice. A special MedGuide will be given to you by the pharmacist with each prescription and refill. Be sure to read this information carefully each time. Talk to your pediatrician regarding the use of this medicine in children. Special care may be needed. Overdosage: If you think you have taken too much of this medicine contact a poison control center or emergency room at once. NOTE: This medicine is only for you. Do not share this medicine with others. What if I miss a dose? If you miss a dose, take it as soon as you can. If it is almost time for your next dose, take only that dose. Do not take double or extra doses. What may interact with this medication?  Do not take this medicine with any of the following medications: gatifloxacin This medicine may also interact with the following  medications: alcohol atazanavir clarithromycin indinavir insulin itraconazole ketoconazole nefazodone nelfinavir ritonavir saquinavir sulfonylureas like glimepiride, glipizide, glyburide telithromycin This list may not describe all possible interactions. Give your health care provider a list of all the medicines, herbs, non-prescription drugs, or dietary supplements you use. Also tell them if you smoke, drink alcohol, or use illegal drugs. Some items may interact with your medicine. What should I watch for while using this medication? Visit your doctor or health care professional for regular checks on your progress. A test called the HbA1C (A1C) will be monitored. This is a simple blood test. It measures your blood sugar control over the last 2 to 3 months. You will receive this test every 3 to 6 months. Learn how to check your blood sugar. Learn the symptoms of low and high blood sugar and how to manage them. Always carry a quick-source of sugar with you in case you have symptoms of low blood sugar. Examples include hard sugar candy or glucose tablets. Make sure others know that you can choke if you eat or drink when you develop serious symptoms of low blood sugar, such as seizures or unconsciousness. They must get medical help at once. Tell your doctor or health care professional if you have high blood sugar. You might need to change the dose of your medicine. If you are sick or exercising more than usual, you might need to change the dose of your medicine. Do not skip meals. Ask your doctor or health care professional if you should avoid alcohol. Many nonprescription cough and cold products contain sugar or alcohol. These can affect blood sugar. Wear a medical ID bracelet or chain, and carry a card that describes your disease and details of your medicine and dosage times. What side effects may I notice from receiving this medication? Side effects that you should report to your doctor or  health care professional as soon as possible: allergic reactions like skin rash, itching or hives, swelling of the face, lips, or tongue breathing problems general ill feeling or flu-like symptoms joint pain loss of appetite redness, blistering, peeling or loosening of the skin, including inside the mouth signs and symptoms of heart failure like breathing problems, fast, irregular heartbeat, sudden weight gain; swelling of the ankles, feet, hands; unusually weak or tired signs and symptoms of low blood sugar such as feeling anxious, confusion, dizziness, increased hunger, unusually weak or tired, sweating, shakiness, cold, irritable, headache, blurred vision, fast heartbeat, loss of consciousness signs and symptoms of muscle injury like dark urine; trouble passing urine or change in the amount of urine; unusually weak or tired; muscle pain; back pain unusual stomach upset or pain vomiting Side effects that usually do not require medical attention (report to your doctor or health care professional if they continue or are bothersome): headache sore throat stuffy or runny nose This list may not describe all possible side effects. Call your doctor for medical advice about side effects. You may report side effects to FDA at 1-800-FDA-1088. Where should I keep my medication? Keep out of the reach of children. Store at room temperature between 15 and 30 degrees C (59 and 86 degrees F). Throw away any unused medicine after the expiration date. NOTE: This sheet is a summary. It may not cover all possible information. If you have questions about this medicine, talk to your doctor, pharmacist, or health care  provider.  2022 Elsevier/Gold Standard (2016-05-22 14:35:50)

## 2021-09-17 NOTE — Progress Notes (Addendum)
 [facility]   HPI [name]60 y.o. severe morbid obese female presents for the management of hypertension. Denies shortness of breath, headaches, chest pain or lower extremity edema.  She recently had a cold and has shortness of breath with and without exertion used a S ABA and no longer has to use it.  Past Medical History:  Diagnosis Date   Hypertension    Left leg DVT (HCC) 2006   s/p C-section, 1 month hospitaliation for staph infection and period of immobility   Obesity    Staphylococcal infection of skin 2006   1 month hospitalization, s/p C section     No Known Allergies    Current Outpatient Medications on File Prior to Visit  Medication Sig Dispense Refill   amLODipine (NORVASC) 10 MG tablet Take 1 tablet (10 mg total) by mouth daily. 90 tablet 1   atorvastatin (LIPITOR) 40 MG tablet Take 1 tablet (40 mg total) by mouth daily. 90 tablet 3   Blood Pressure Monitoring KIT 1 Bag by Does not apply route 3 (three) times daily. 1 kit 0   cloNIDine (CATAPRES) 0.2 MG tablet Take 1 tablet (0.2 mg total) by mouth daily. 90 tablet 1   ferrous fumarate (HEMOCYTE - 106 MG FE) 325 (106 FE) MG TABS tablet Take 1 tablet by mouth.     fluticasone (FLONASE) 50 MCG/ACT nasal spray Place 2 sprays into both nostrils daily. 16 g 6   ibuprofen (ADVIL,MOTRIN) 200 MG tablet Take 600 mg by mouth every 6 (six) hours as needed for moderate pain (Pain).      losartan-hydrochlorothiazide (HYZAAR) 100-25 MG tablet Take 1 tablet by mouth once daily 90 tablet 0   metoprolol tartrate (LOPRESSOR) 25 MG tablet Take 1 tablet by mouth twice daily 180 tablet 0   SYMBICORT 80-4.5 MCG/ACT inhaler Inhale 2 puffs by mouth twice daily 11 g 0   VENTOLIN HFA 108 (90 Base) MCG/ACT inhaler INHALE 2 PUFFS BY MOUTH EVERY 6 HOURS AS NEEDED FOR WHEEZING FOR SHORTNESS OF BREATH 18 g 0   No current facility-administered medications on file prior to visit.    ROS: all negative except above.    Filed Weights   09/17/21 1358  Weight: (!) 319 lb 6.4 oz (144.9 kg)   BP (!) 148/86 (BP Location: Right Arm, Patient Position: Sitting, Cuff Size: Large)   Pulse 88   Temp (!) 97.5 F (36.4 C) (Temporal)   Ht 5' 2" (1.575 m)   Wt (!) 319 lb 6.4 oz (144.9 kg)   LMP 07/19/2019 (Approximate)   SpO2 93%   BMI 58.42 kg/m   Physical Exam: General Appearance: Well nourished, severe morbid obese in no apparent distress. Eyes: PERRLA, EOMs, conjunctiva no swelling or erythema Sinuses: No Frontal/maxillary tenderness ENT/Mouth: Ext aud canals clear, TMs without erythema, bulging. Hearing normal.  Neck: Supple, thyroid normal.  Respiratory: Respiratory effort normal, BS equal bilaterally without rales, rhonchi, wheezing or stridor.  Cardio: RRR with no MRGs. Brisk peripheral pulses without edema.  Abdomen: Soft, + BS.  Non tender, no guarding, rebound, hernias, masses. Lymphatics: Non tender without lymphadenopathy.  Musculoskeletal: Full ROM, 5/5 strength, normal gait.  Skin: Warm, dry without rashes, lesions, ecchymosis.  Neuro: Cranial nerves intact. Normal muscle tone, no cerebellar symptoms. Sensation intact.  Psych: Awake and oriented X 3, normal affect, Insight and Judgment appropriate.   [name] was seen today for hypertension.  Diagnoses and all orders for this visit:  Need for immunization against influenza -       Flu Vaccine QUAD 53moIM (Fluarix, Fluzone & Alfiuria Quad PF)  Morbid [place] Will reevaluate weight beginning of the year due to upcoming celebrations [employer] homecoming.  Thanksgiving, Christmas, and New Year's She is not a candidate for phentermine will add on her AVS for review Saxagliptin and OZEMPIC  Essential hypertension Patient has admitted that she has been out of 2 of her blood pressure medication reading today systolic is elevated.  On follow-up will reevaluate blood pressure. Bp at home systolic [id number] diastolic [phone] Counseled on blood  pressure goal of less than 130/80, low-sodium, DASH diet, medication compliance, 150 minutes of moderate intensity exercise per week. Discussed medication compliance, adverse effects.   Colon cancer screening -     Ambulatory referral to Gastroenterology    MKerin Perna NP 2:39 PM

## 2021-12-18 ENCOUNTER — Ambulatory Visit (INDEPENDENT_AMBULATORY_CARE_PROVIDER_SITE_OTHER): Payer: BC Managed Care – PPO | Admitting: Primary Care

## 2022-01-05 ENCOUNTER — Encounter (INDEPENDENT_AMBULATORY_CARE_PROVIDER_SITE_OTHER): Payer: Self-pay | Admitting: Primary Care

## 2022-01-05 ENCOUNTER — Ambulatory Visit (INDEPENDENT_AMBULATORY_CARE_PROVIDER_SITE_OTHER): Payer: BC Managed Care – PPO | Admitting: Primary Care

## 2022-01-05 ENCOUNTER — Other Ambulatory Visit: Payer: Self-pay

## 2022-01-05 VITALS — BP 148/80 | HR 73 | Temp 97.9°F | Ht 61.0 in | Wt 322.6 lb

## 2022-01-05 DIAGNOSIS — D509 Iron deficiency anemia, unspecified: Secondary | ICD-10-CM | POA: Diagnosis not present

## 2022-01-05 DIAGNOSIS — Z23 Encounter for immunization: Secondary | ICD-10-CM | POA: Diagnosis not present

## 2022-01-05 DIAGNOSIS — I10 Essential (primary) hypertension: Secondary | ICD-10-CM | POA: Diagnosis not present

## 2022-01-05 DIAGNOSIS — J4520 Mild intermittent asthma, uncomplicated: Secondary | ICD-10-CM

## 2022-01-05 DIAGNOSIS — Z760 Encounter for issue of repeat prescription: Secondary | ICD-10-CM

## 2022-01-05 DIAGNOSIS — E782 Mixed hyperlipidemia: Secondary | ICD-10-CM

## 2022-01-05 DIAGNOSIS — Z1211 Encounter for screening for malignant neoplasm of colon: Secondary | ICD-10-CM

## 2022-01-05 MED ORDER — CLONIDINE HCL 0.2 MG PO TABS: 0.2000 mg | ORAL_TABLET | Freq: Every day | ORAL | 1 refills | Status: DC

## 2022-01-05 MED ORDER — ATORVASTATIN CALCIUM 40 MG PO TABS: 40.0000 mg | ORAL_TABLET | Freq: Every day | ORAL | 0 refills | Status: DC

## 2022-01-05 MED ORDER — METOPROLOL TARTRATE 25 MG PO TABS: 25.0000 mg | ORAL_TABLET | Freq: Two times a day (BID) | ORAL | 1 refills | Status: DC

## 2022-01-05 MED ORDER — SYMBICORT 80-4.5 MCG/ACT IN AERO: 2.0000 | INHALATION_SPRAY | Freq: Two times a day (BID) | RESPIRATORY_TRACT | 6 refills | Status: DC

## 2022-01-05 MED ORDER — LOSARTAN POTASSIUM-HCTZ 100-25 MG PO TABS: 1.0000 | ORAL_TABLET | Freq: Every day | ORAL | 1 refills | Status: DC

## 2022-01-05 MED ORDER — AMLODIPINE BESYLATE 10 MG PO TABS: 10.0000 mg | ORAL_TABLET | Freq: Every day | ORAL | 1 refills | Status: DC

## 2022-01-05 NOTE — Progress Notes (Signed)
[name]  [name], is a 61 y.o. female  TGP:[id number]  XEN:[id number]  DOB - 1961-10-17  Chief Complaint  Patient presents with   Obesity   Blood Pressure Check       Subjective:   [name] is a 61 y.o. female here today for a follow up visit. Patient has No headache, No chest pain, No abdominal pain - No Nausea, No new weakness tingling or numbness, No Cough - SOB.  No problems updated.  ALLERGIES: No Known Allergies  PAST MEDICAL HISTORY: Past Medical History:  Diagnosis Date   Hypertension    Left leg DVT ([place]) 2006   s/p C-section, 1 month hospitaliation for staph infection and period of immobility   Obesity    Staphylococcal infection of skin 2006   1 month hospitalization, s/p C section    MEDICATIONS AT HOME: Prior to Admission medications   Medication Sig Start Date End Date Taking? Authorizing Provider  Blood Pressure Monitoring KIT 1 Bag by Does not apply route 3 (three) times daily. 11/17/19  Yes Kerin Perna, NP  fluticasone (FLONASE) 50 MCG/ACT nasal spray Place 2 sprays into both nostrils daily. 05/13/20  Yes Kerin Perna, NP  ibuprofen (ADVIL,MOTRIN) 200 MG tablet Take 600 mg by mouth every 6 (six) hours as needed for moderate pain (Pain).    Yes [provider]  VENTOLIN HFA 108 (90 Base) MCG/ACT inhaler INHALE 2 PUFFS BY MOUTH EVERY 6 HOURS AS NEEDED FOR WHEEZING FOR SHORTNESS OF BREATH 09/17/21  Yes Kerin Perna, NP  amLODipine (NORVASC) 10 MG tablet Take 1 tablet (10 mg total) by mouth daily. 01/05/22   Kerin Perna, NP  atorvastatin (LIPITOR) 40 MG tablet Take 1 tablet (40 mg total) by mouth daily. 01/05/22   Kerin Perna, NP  cloNIDine (CATAPRES) 0.2 MG tablet Take 1 tablet (0.2 mg total) by mouth daily. 01/05/22   Kerin Perna, NP  ferrous fumarate (HEMOCYTE - 106 MG FE) 325 (106 FE) MG TABS tablet Take 1 tablet by mouth. Patient not taking: Reported on 01/05/2022     [provider]  losartan-hydrochlorothiazide (HYZAAR) 100-25 MG tablet Take 1 tablet by mouth daily. 01/05/22   Kerin Perna, NP  metoprolol tartrate (LOPRESSOR) 25 MG tablet Take 1 tablet (25 mg total) by mouth 2 (two) times daily. 01/05/22   Kerin Perna, NP  SYMBICORT 80-4.5 MCG/ACT inhaler Inhale 2 puffs into the lungs 2 (two) times daily. 01/05/22   Kerin Perna, NP    Objective:   Vitals:   01/05/22 1550  BP: (!) 148/80  Pulse: 73  Temp: 97.9 F (36.6 C)  TempSrc: Oral  SpO2: 97%  Weight: (!) 322 lb 9.6 oz (146.3 kg)  Height: _0  (1.549 m)   Exam General appearance : Awake, alert, not in any distress. Speech Clear. Not toxic looking HEENT: Atraumatic and Normocephalic, pupils equally reactive to light and accomodation Neck: Supple, no JVD. No cervical lymphadenopathy.  Chest: Good air entry bilaterally, LLL wheezes  CVS: S1 S2 regular, no murmurs.  Abdomen: Bowel sounds present, Non tender and not distended with no gaurding, rigidity or rebound. Extremities: B/L Lower Ext shows no edema, both legs are warm to touch Neurology: Awake alert, and oriented X 3, CN II-XII intact, Non focal Skin: No Rash  Data Review Lab Results  Component Value Date   HGBA1C 5.6 02/05/2021   HGBA1C 5.6 01/31/2014    Assessment & Plan   1.  Need for shingles vaccine - Varicella-zoster vaccine IM (Shingrix)  2. HYPERTENSION, BENIGN Counseled on blood pressure goal of less than 130/80, low-sodium, DASH diet, medication compliance, 150 minutes of moderate intensity exercise per week. Discussed medication compliance, adverse effects.  Patient takes medication for blood pressure in the morning and in the evening.  Currently on 5 medications.  The clonidine 0.2 mg is taking at bedtime because of side effect of sleepiness and metoprolol 25 mg taking twice daily that is also taking at bedtime.  She has a blood pressure machine at home she will check her blood pressure  for approximately a week or so around 9 PM and record readings and for them to her MyChart for PCP  to review - amLODipine (NORVASC) 10 MG tablet; Take 1 tablet (10 mg total) by mouth daily.  Dispense: 90 tablet; Refill: 1 - cloNIDine (CATAPRES) 0.2 MG tablet; Take 1 tablet (0.2 mg total) by mouth daily.  Dispense: 90 tablet; Refill: 1 - losartan-hydrochlorothiazide (HYZAAR) 100-25 MG tablet; Take 1 tablet by mouth daily.  Dispense: 90 tablet; Refill: 1 - CMP14+EGFR; Future  3. Medication refill  - amLODipine (NORVASC) 10 MG tablet; Take 1 tablet (10 mg total) by mouth daily.  Dispense: 90 tablet; Refill: 1 - cloNIDine (CATAPRES) 0.2 MG tablet; Take 1 tablet (0.2 mg total) by mouth daily.  Dispense: 90 tablet; Refill: 1 - losartan-hydrochlorothiazide (HYZAAR) 100-25 MG tablet; Take 1 tablet by mouth daily.  Dispense: 90 tablet; Refill: 1 - metoprolol tartrate (LOPRESSOR) 25 MG tablet; Take 1 tablet (25 mg total) by mouth 2 (two) times daily.  Dispense: 180 tablet; Refill: 1  4. Mild intermittent asthma without complication Left lower lobe wheeze-rest out of the house this morning did not take her long-acting inhaler but she keeps her rescue inhaler of her at all times - SYMBICORT 80-4.5 MCG/ACT inhaler; Inhale 2 puffs into the lungs 2 (two) times daily.  Dispense: 11 g; Refill: 6  5. Mixed hyperlipidemia  Healthy lifestyle diet of fruits vegetables fish nuts whole grains and low saturated fat . Foods high in cholesterol or liver, fatty meats,cheese, butter avocados, nuts and seeds, chocolate and fried foods. - atorvastatin (LIPITOR) 40 MG tablet; Take 1 tablet (40 mg total) by mouth daily.  Dispense: 30 tablet; Refill: 0 - Lipid panel; Future  6. Colon cancer screening - Cologuard  7. Microcytic normochromic anemia - CBC with Differential/Platelet; Future    Patient have been counseled extensively about nutrition and exercise. Other issues discussed during this visit include: low  cholesterol diet, weight control and daily exercise, foot care, annual eye examinations at Ophthalmology, importance of adherence with medications and regular follow-up. We also discussed long term complications of uncontrolled diabetes and hypertension.   No follow-ups on file.  The patient was given clear instructions to go to ER or return to medical center if symptoms don't improve, worsen or new problems develop. The patient verbalized understanding. The patient was told to call to get lab results if they haven't heard anything in the next week.   This note has been created with Surveyor, quantity. Any transcriptional errors are unintentional.   Kerin Perna, NP 01/05/2022, 4:24 PM

## 2022-01-05 NOTE — Patient Instructions (Signed)
Zoster Vaccine, Recombinant injection °What is this medication? °ZOSTER VACCINE (ZOS ter vak SEEN) is a vaccine used to reduce the risk of getting shingles. This vaccine is not used to treat shingles or nerve pain from shingles. °This medicine may be used for other purposes; ask your health care provider or pharmacist if you have questions. °COMMON BRAND NAME(S): SHINGRIX °What should I tell my care team before I take this medication? °They need to know if you have any of these conditions: °cancer °immune system problems °an unusual or allergic reaction to Zoster vaccine, other medications, foods, dyes, or preservatives °pregnant or trying to get pregnant °breast-feeding °How should I use this medication? °This vaccine is injected into a muscle. It is given by a health care provider. °A copy of Vaccine Information Statements will be given before each vaccination. Be sure to read this information carefully each time. This sheet may change often. °Talk to your health care provider about the use of this vaccine in children. This vaccine is not approved for use in children. °Overdosage: If you think you have taken too much of this medicine contact a poison control center or emergency room at once. °NOTE: This medicine is only for you. Do not share this medicine with others. °What if I miss a dose? °Keep appointments for follow-up (booster) doses. It is important not to miss your dose. Call your health care provider if you are unable to keep an appointment. °What may interact with this medication? °medicines that suppress your immune system °medicines to treat cancer °steroid medicines like prednisone or cortisone °This list may not describe all possible interactions. Give your health care provider a list of all the medicines, herbs, non-prescription drugs, or dietary supplements you use. Also tell them if you smoke, drink alcohol, or use illegal drugs. Some items may interact with your medicine. °What should I watch for  while using this medication? °Visit your health care provider regularly. °This vaccine, like all vaccines, may not fully protect everyone. °What side effects may I notice from receiving this medication? °Side effects that you should report to your doctor or health care professional as soon as possible: °allergic reactions (skin rash, itching or hives; swelling of the face, lips, or tongue) °trouble breathing °Side effects that usually do not require medical attention (report these to your doctor or health care professional if they continue or are bothersome): °chills °headache °fever °nausea °pain, redness, or irritation at site where injected °tiredness °vomiting °This list may not describe all possible side effects. Call your doctor for medical advice about side effects. You may report side effects to FDA at 1-800-FDA-1088. °Where should I keep my medication? °This vaccine is only given by a health care provider. It will not be stored at home. °NOTE: This sheet is a summary. It may not cover all possible information. If you have questions about this medicine, talk to your doctor, pharmacist, or health care provider. °© 2022 Elsevier/Gold Standard (2021-08-12 00:00:00) ° °

## 2022-01-07 ENCOUNTER — Other Ambulatory Visit: Payer: Self-pay

## 2022-01-07 ENCOUNTER — Other Ambulatory Visit (INDEPENDENT_AMBULATORY_CARE_PROVIDER_SITE_OTHER): Payer: BC Managed Care – PPO

## 2022-01-07 DIAGNOSIS — D509 Iron deficiency anemia, unspecified: Secondary | ICD-10-CM | POA: Diagnosis not present

## 2022-01-07 DIAGNOSIS — I10 Essential (primary) hypertension: Secondary | ICD-10-CM

## 2022-01-07 DIAGNOSIS — E782 Mixed hyperlipidemia: Secondary | ICD-10-CM

## 2022-01-08 LAB — CMP14+EGFR
ALT: 15 IU/L (ref 0–32)
AST: 19 IU/L (ref 0–40)
Albumin/Globulin Ratio: 1.4 (ref 1.2–2.2)
Albumin: 4.2 g/dL (ref 3.8–4.9)
Alkaline Phosphatase: 102 IU/L (ref 44–121)
BUN/Creatinine Ratio: 11 (ref 9–23)
BUN: 10 mg/dL (ref 6–24)
Bilirubin Total: 0.3 mg/dL (ref 0.0–1.2)
CO2: 26 mmol/L (ref 20–29)
Calcium: 9.7 mg/dL (ref 8.7–10.2)
Chloride: 102 mmol/L (ref 96–106)
Creatinine, Ser: 0.9 mg/dL (ref 0.57–1.00)
Globulin, Total: 2.9 g/dL (ref 1.5–4.5)
Glucose: 85 mg/dL (ref 70–99)
Potassium: 4 mmol/L (ref 3.5–5.2)
Sodium: 142 mmol/L (ref 134–144)
Total Protein: 7.1 g/dL (ref 6.0–8.5)
eGFR: 74 mL/min/{1.73_m2} (ref >59)

## 2022-01-08 LAB — CBC WITH DIFFERENTIAL/PLATELET
Basophils Absolute: 0 10*3/uL (ref 0.0–0.2)
Basos: 0 %
EOS (ABSOLUTE): 0.2 10*3/uL (ref 0.0–0.4)
Eos: 2 %
Hematocrit: 40.7 % (ref 34.0–46.6)
Hemoglobin: 13 g/dL (ref 11.1–15.9)
Immature Grans (Abs): 0 10*3/uL (ref 0.0–0.1)
Immature Granulocytes: 0 %
Lymphocytes Absolute: 3.5 10*3/uL — ABNORMAL HIGH (ref 0.7–3.1)
Lymphs: 37 %
MCH: 24.8 pg — ABNORMAL LOW (ref 26.6–33.0)
MCHC: 31.9 g/dL (ref 31.5–35.7)
MCV: 78 fL — ABNORMAL LOW (ref 79–97)
Monocytes Absolute: 0.9 10*3/uL (ref 0.1–0.9)
Monocytes: 9 %
Neutrophils Absolute: 4.9 10*3/uL (ref 1.4–7.0)
Neutrophils: 52 %
Platelets: 226 10*3/uL (ref 150–450)
RBC: 5.24 x10E6/uL (ref 3.77–5.28)
RDW: 15.4 % (ref 11.7–15.4)
WBC: 9.5 10*3/uL (ref 3.4–10.8)

## 2022-01-08 LAB — LIPID PANEL
Chol/HDL Ratio: 2.7 ratio (ref 0.0–4.4)
Cholesterol, Total: 138 mg/dL (ref 100–199)
HDL: 52 mg/dL (ref >39)
LDL Chol Calc (NIH): 62 mg/dL (ref 0–99)
Triglycerides: 138 mg/dL (ref 0–149)
VLDL Cholesterol Cal: 24 mg/dL (ref 5–40)

## 2022-01-31 DIAGNOSIS — Z1211 Encounter for screening for malignant neoplasm of colon: Secondary | ICD-10-CM | POA: Diagnosis not present

## 2022-02-07 LAB — COLOGUARD: COLOGUARD: POSITIVE — AB

## 2022-02-09 ENCOUNTER — Other Ambulatory Visit (INDEPENDENT_AMBULATORY_CARE_PROVIDER_SITE_OTHER): Payer: Self-pay | Admitting: Primary Care

## 2022-02-09 DIAGNOSIS — R195 Other fecal abnormalities: Secondary | ICD-10-CM

## 2022-03-18 ENCOUNTER — Ambulatory Visit
Admission: EM | Admit: 2022-03-18 | Discharge: 2022-03-18 | Disposition: A | Discharge status: Home / Self Care | Payer: BC Managed Care – PPO | Attending: Internal Medicine | Admitting: Internal Medicine

## 2022-03-18 ENCOUNTER — Ambulatory Visit (INDEPENDENT_AMBULATORY_CARE_PROVIDER_SITE_OTHER): Payer: BC Managed Care – PPO

## 2022-03-18 ENCOUNTER — Telehealth (INDEPENDENT_AMBULATORY_CARE_PROVIDER_SITE_OTHER): Payer: Self-pay | Admitting: Primary Care

## 2022-03-18 ENCOUNTER — Ambulatory Visit (INDEPENDENT_AMBULATORY_CARE_PROVIDER_SITE_OTHER): Payer: Self-pay

## 2022-03-18 DIAGNOSIS — M25551 Pain in right hip: Secondary | ICD-10-CM | POA: Diagnosis not present

## 2022-03-18 MED ORDER — METHOCARBAMOL 500 MG PO TABS: 500.0000 mg | ORAL_TABLET | Freq: Every day | ORAL | 0 refills | Status: DC | PRN

## 2022-03-18 NOTE — ED Provider Notes (Addendum)
?[facility] ? ? ? ?CSN: [id number] ?Arrival date & time: 03/18/22  1119 ? ? ?  ? ?History   ?Chief Complaint ?Chief Complaint  ?Patient presents with  ? right hip pain situation  ? ? ?HPI ?[name] is a 61 y.o. female.  ? ?Patient presents with right hip/buttocks pain that extends down to right knee that started approximately 5 days ago.  Patient reports some numbness that radiates down the leg.  Denies any tingling.  Denies any apparent injury or any history of chronic pain in that area.  Denies any urinary symptoms, urinary or bowel continence, saddle anesthesia.  Movement exacerbates pain.  Patient has taken NSAIDs and Tylenol with minimal improvement in pain. Patient has history of DVT in the past but denies that this feels the same.  ? ? ? ?Past Medical History:  ?Diagnosis Date  ? Hypertension   ? Left leg DVT ([place]) 2006  ? s/p C-section, 1 month hospitaliation for staph infection and period of immobility  ? Obesity   ? Staphylococcal infection of skin 2006  ? 1 month hospitalization, s/p C section  ? ? ?Patient Active Problem List  ? Diagnosis Date Noted  ? Obesity, unspecified 01/31/2014  ? TOBACCO USER 09/14/2009  ? SEBACEOUS CYST 06/17/2007  ? 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 DEGENERATIVE JOINT DISEASE, KNEE 05/30/2007  ? 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 BUNIONS, BILATERAL 05/27/2007  ? LEG PAIN, RIGHT 05/27/2007  ? PES PLANUS 05/27/2007  ? HYPERTENSION, BENIGN 02/18/2007  ? BAKERS CYST 02/03/2007  ? ? ?Past Surgical History:  ?Procedure Laterality Date  ? ABDOMINAL EXPLORATION SURGERY  2006  ? staph infection s/p C-section  ? CESAREAN SECTION    ? staph infection s/p childbirth by C-section, then Exploratory abdominal surgery done.  ? TUBAL LIGATION  2006  ? ? ?OB History   ?No obstetric history on file. ?  ? ? ? ?Home Medications   ? ?Prior to Admission medications   ?Medication Sig Start Date End Date Taking? Authorizing Provider  ?methocarbamol (ROBAXIN) 500 MG tablet Take 1 tablet (500 mg  total) by mouth daily as needed for muscle spasms. 03/18/22  Yes Olympia Adelsberger, Hildred Alamin E, FNP  ?amLODipine (NORVASC) 10 MG tablet Take 1 tablet (10 mg total) by mouth daily. 01/05/22   Kerin Perna, NP  ?atorvastatin (LIPITOR) 40 MG tablet Take 1 tablet (40 mg total) by mouth daily. 01/05/22   Kerin Perna, NP  ?Blood Pressure Monitoring KIT 1 Bag by Does not apply route 3 (three) times daily. 11/17/19   Kerin Perna, NP  ?cloNIDine (CATAPRES) 0.2 MG tablet Take 1 tablet (0.2 mg total) by mouth daily. 01/05/22   Kerin Perna, NP  ?ferrous fumarate (HEMOCYTE - 106 MG FE) 325 (106 FE) MG TABS tablet Take 1 tablet by mouth. ?Patient not taking: Reported on 01/05/2022    [provider]  ?fluticasone (FLONASE) 50 MCG/ACT nasal spray Place 2 sprays into both nostrils daily. 05/13/20   Kerin Perna, NP  ?ibuprofen (ADVIL,MOTRIN) 200 MG tablet Take 600 mg by mouth every 6 (six) hours as needed for moderate pain (Pain).     [provider]  ?losartan-hydrochlorothiazide (HYZAAR) 100-25 MG tablet Take 1 tablet by mouth daily. 01/05/22   Kerin Perna, NP  ?metoprolol tartrate (LOPRESSOR) 25 MG tablet Take 1 tablet (25 mg total) by mouth 2 (two) times daily. 01/05/22   Kerin Perna, NP  ?Dellis Anes 80-4.5 MCG/ACT inhaler Inhale 2 puffs into the lungs  2 (two) times daily. 01/05/22   Kerin Perna, NP  ?VENTOLIN HFA 108 (90 Base) MCG/ACT inhaler INHALE 2 PUFFS BY MOUTH EVERY 6 HOURS AS NEEDED FOR WHEEZING FOR SHORTNESS OF BREATH 09/17/21   Kerin Perna, NP  ? ? ?Family History ?Family History  ?Problem Relation Age of Onset  ? Hypertension Mother   ? Diabetes Mother   ? Kidney disease Father   ?     transplant, and subsequent failure   ? Pneumonia Father   ?     died of pneumonia  ? Hypertension Father   ? Cancer Brother   ?     stomach  ? Alcohol abuse Brother   ? Ulcers Brother   ?     bleeding ulcer related to alcohol abuse  ? Heart disease Neg Hx   ? Stroke Neg Hx    ? Colon cancer Neg Hx   ? ? ?Social History ?Social History  ? ?Tobacco Use  ? Smoking status: Every Day  ?  Packs/day: 0.50  ?  Years: 20.00  ?  Pack years: 10.00  ?  Types: Cigarettes  ? Smokeless tobacco: Never  ?Vaping Use  ? Vaping Use: Never used  ?Substance Use Topics  ? Alcohol use: No  ? Drug use: No  ? ? ? ?Allergies   ?Patient has no known allergies. ? ? ?Review of Systems ?Review of Systems ?Per HPI ? ?Physical Exam ?Triage Vital Signs ?ED Triage Vitals [03/18/22 1257]  ?Enc Vitals Group  ?   BP (!) 143/81  ?   Pulse Rate 72  ?   Resp 18  ?   Temp 98 ?F (36.7 ?C)  ?   Temp Source Oral  ?   SpO2 93 %  ?   Weight   ?   Height   ?   Head Circumference   ?   Peak Flow   ?   Pain Score 0  ?   Pain Loc   ?   Pain Edu?   ?   Excl. in [place]?   ? ?No data found. ? ?Updated Vital Signs ?BP (!) 143/81 (BP Location: Left Arm)   Pulse 72   Temp 98 ?F (36.7 ?C) (Oral)   Resp 18   LMP 07/19/2019 (Approximate)   SpO2 93%  ? ?Visual Acuity ?Right Eye Distance:   ?Left Eye Distance:   ?Bilateral Distance:   ? ?Right Eye Near:   ?Left Eye Near:    ?Bilateral Near:    ? ?Physical Exam ?Constitutional:   ?   General: She is not in acute distress. ?   Appearance: Normal appearance. She is not toxic-appearing or diaphoretic.  ?HENT:  ?   Head: Normocephalic and atraumatic.  ?Eyes:  ?   Extraocular Movements: Extraocular movements intact.  ?   Conjunctiva/sclera: Conjunctivae normal.  ?Pulmonary:  ?   Effort: Pulmonary effort is normal.  ?Musculoskeletal:  ?   Comments: No tenderness to palpation throughout right lower extremity.  Patient has full range of motion.  Pulses normal.  Capillary refill intact.  No obvious discoloration or swelling noted.  No abrasions or lacerations noted.  ?Neurological:  ?   General: No focal deficit present.  ?   Mental Status: She is alert and oriented to person, place, and time. Mental status is at baseline.  ?Psychiatric:     ?   Mood and Affect: Mood normal.     ?   Behavior: Behavior  normal.     ?  Thought Content: Thought content normal.     ?   Judgment: Judgment normal.  ? ? ? ?UC Treatments / Results  ?Labs ?(all labs ordered are listed, but only abnormal results are displayed) ?Labs Reviewed - No data to display ? ?EKG ? ? ?Radiology ?DG Hip Unilat With Pelvis 2-3 Views Right ? ?Result Date: 03/18/2022 ?CLINICAL DATA:  61 year old female with right hip pain. EXAM: DG HIP (WITH OR WITHOUT PELVIS) 2-3V RIGHT COMPARISON:  None. FINDINGS: There is no evidence of hip fracture or dislocation. There is no evidence of arthropathy or other focal bone abnormality. IMPRESSION: No acute fracture, malalignment, or evidence of significant degenerative change. Electronically Signed   By: Ruthann Cancer M.D.   On: 03/18/2022 13:38   ? ?Procedures ?Procedures (including critical care time) ? ?Medications Ordered in UC ?Medications - No data to display ? ?Initial Impression / Assessment and Plan / UC Course  ?I have reviewed the triage vital signs and the nursing notes. ? ?Pertinent labs & imaging results that were available during my care of the patient were reviewed by me and considered in my medical decision making (see chart for details). ? ?  ? ?Right hip x-ray was negative for any acute bony abnormality.  Suspect muscular injury/strain.  She will need to follow-up with orthopedist at provided contact information for further evaluation and management.  Limited options on pain management given patient's history and current medications.  Patient has already been taking NSAIDs and Tylenol.  Unable to give Toradol IM as patient just took ibuprofen prior to arrival.  Encouraged patient to alternate ice and heat application to affected area.  Unable to prescribe prednisone given chart review that showed possible positive colon cancer screening.  Unable to see the results so unable to confirm this.  Will avoid prednisone due to this at this time.  Other option is a muscle relaxant but patient was advised that  combination of this with clonidine can cause increased drowsiness.  Patient wishes to have muscle relaxer even though the risks were discussed.  Patient voiced understanding of risks.  Advised patient to take at

## 2022-03-18 NOTE — Telephone Encounter (Signed)
?  Chief Complaint: right hip ache and upper thigh pain ?Symptoms: constant ache, mild swelling and pain shoots down leg, numbness right upper thigh ?Frequency: since last Friday ?Pertinent Negatives: Patient denies fever, rash, back pain ?Disposition: [] ED /[] Urgent Care (no appt availability in office) / [] Appointment(In office/virtual)/ []  [facility]/ [] Home Care/ [] Refused Recommended Disposition /[] [facility]/ []  Follow-up with PCP ?Additional Notes: no appt available until a month from now. Called office for assistance ). Unable to warm transfer, advised pt that she will get a phone call to set up appt. ? ? ?Reason for Disposition ? [1] MODERATE pain (e.g., interferes with normal activities, limping) AND [2] present > 3 days ? ?Answer Assessment - Initial Assessment Questions ?1. LOCATION and RADIATION: "Where is the pain located?"  ?    Right hip ?2. QUALITY: "What does the pain feel like?"  (e.g., sharp, dull, aching, burning) ?    Achy  ?3. SEVERITY: "How bad is the pain?" "What does it keep you from doing?"   (Scale 1-10; or mild, moderate, severe) ?  -  MILD (1-3): doesn't interfere with normal activities  ?  -  MODERATE (4-7): interferes with normal activities (e.g., work or school) or awakens from sleep, limping  ?  -  SEVERE (8-10): excruciating pain, unable to do any normal activities, unable to walk ?    moderate ?4. ONSET: "When did the pain start?" "Does it come and go, or is it there all the time?" ?    Last Friday ?5. WORK OR EXERCISE: "Has there been any recent work or exercise that involved this part of the body?"  ?    no ?6. CAUSE: "What do you think is causing the hip pain?"  ?    unknown ?7. AGGRAVATING FACTORS: "What makes the hip pain worse?" (e.g., walking, climbing stairs, running) ? All positons  ?8. OTHER SYMPTOMS: "Do you have any other symptoms?" (e.g., back pain, pain shooting down leg,  fever, rash) ?    Pain shooting down leg, numbness right  upper thigh ? ?Protocols used: Hip Pain-A-AH ? ?

## 2022-03-18 NOTE — ED Triage Notes (Signed)
Pt c/o right hip pain that has migrated down into right knee. States this morning the knee region started feeling numb. Tried advil/tylenol with relief at first but doesn't help any more. States it is a constant ache preventing sleep.  ? ?Onset ~ Friday  ?

## 2022-03-18 NOTE — Telephone Encounter (Signed)
Spoke to Ms. Lempke about scheduling her an appointment on Monday. She stated she could not wait that long. Tempest CMA informed me to have she to be checked out at an Urgent Care. Ms. [place] stated she would. ?

## 2022-03-18 NOTE — Discharge Instructions (Signed)
Please follow-up with orthopedist for further evaluation and management.  You have been prescribed a muscle relaxer but please be advised that it can cause drowsiness.  Please take at least 12 hours apart from clonidine.  Stop taking if it causes signs of drowsiness.  Do not drive while taking this medication. ?

## 2022-04-01 ENCOUNTER — Ambulatory Visit (INDEPENDENT_AMBULATORY_CARE_PROVIDER_SITE_OTHER): Payer: BC Managed Care – PPO

## 2022-04-01 ENCOUNTER — Ambulatory Visit
Admission: EM | Admit: 2022-04-01 | Discharge: 2022-04-01 | Disposition: A | Discharge status: Home / Self Care | Payer: BC Managed Care – PPO | Attending: Internal Medicine | Admitting: Internal Medicine

## 2022-04-01 ENCOUNTER — Other Ambulatory Visit (INDEPENDENT_AMBULATORY_CARE_PROVIDER_SITE_OTHER): Payer: Self-pay | Admitting: Primary Care

## 2022-04-01 ENCOUNTER — Encounter: Payer: Self-pay | Admitting: Emergency Medicine

## 2022-04-01 DIAGNOSIS — M25561 Pain in right knee: Secondary | ICD-10-CM | POA: Diagnosis not present

## 2022-04-01 DIAGNOSIS — E782 Mixed hyperlipidemia: Secondary | ICD-10-CM

## 2022-04-01 MED ORDER — PREDNISONE 20 MG PO TABS: 40.0000 mg | ORAL_TABLET | Freq: Every day | ORAL | 0 refills | Status: AC

## 2022-04-01 NOTE — ED Provider Notes (Signed)
?[facility] ? ? ? ?CSN: [id number] ?Arrival date & time: 04/01/22  1224 ? ? ?  ? ?History   ?Chief Complaint ?Chief Complaint  ?Patient presents with  ? Knee Pain  ? ? ?HPI ?[name] is a 61 y.o. female.  ? ?Patient presents with right knee pain that has been present for approximately 3 days.  Denies any apparent injury.  Patient reports history of knee pain approximately 15 to 20 years ago and she was given an injection that resolved the pain.  She was thought to have arthritis in that knee but has not provided any problems over the past few years.  Patient is taking Aleve for pain with minimal improvement.  Pain mainly occurs with bearing weight.  Pain is present in the right anterior knee.  Denies any numbness or tingling. Denies fever.  ? ? ?Knee Pain ? ?Past Medical History:  ?Diagnosis Date  ? Hypertension   ? Left leg DVT ([facility]CC) 2006  ? s/p C-section, 1 month hospitaliation for staph infection and period of immobility  ? Obesity   ? Staphylococcal infection of skin 2006  ? 1 month hospitalization, s/p C section  ? ? ?Patient Active Problem List  ? Diagnosis Date Noted  ? Obesity, unspecified 01/31/2014  ? TOBACCO USER 09/14/2009  ? SEBACEOUS CYST 06/17/2007  ? 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 DEGENERATIVE JOINT DISEASE, KNEE 05/30/2007  ? 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 BUNIONS, BILATERAL 05/27/2007  ? LEG PAIN, RIGHT 05/27/2007  ? PES PLANUS 05/27/2007  ? HYPERTENSION, BENIGN 02/18/2007  ? BAKERS CYST 02/03/2007  ? ? ?Past Surgical History:  ?Procedure Laterality Date  ? ABDOMINAL EXPLORATION SURGERY  2006  ? staph infection s/p C-section  ? CESAREAN SECTION    ? staph infection s/p childbirth by C-section, then Exploratory abdominal surgery done.  ? TUBAL LIGATION  2006  ? ? ?OB History   ?No obstetric history on file. ?  ? ? ? ?Home Medications   ? ?Prior to Admission medications   ?Medication Sig Start Date End Date Taking? Authorizing Provider  ?amLODipine (NORVASC) 10 MG tablet Take 1 tablet  (10 mg total) by mouth daily. 01/05/22  Yes Edwards, Michelle P, NP  ?atorvastatin (LIPITOR) 40 MG tablet Take 1 tablet (40 mg total) by mouth daily. 01/05/22  Yes Edwards, Michelle P, NP  ?Blood Pressure Monitoring KIT 1 Bag by Does not apply route 3 (three) times daily. 11/17/19  Yes Edwards, Michelle P, NP  ?cloNIDine (CATAPRES) 0.2 MG tablet Take 1 tablet (0.2 mg total) by mouth daily. 01/05/22  Yes Edwards, Michelle P, NP  ?fluticasone (FLONASE) 50 MCG/ACT nasal spray Place 2 sprays into both nostrils daily. 05/13/20  Yes Edwards, Michelle P, NP  ?ibuprofen (ADVIL,MOTRIN) 200 MG tablet Take 600 mg by mouth every 6 (six) hours as needed for moderate pain (Pain).    Yes [provider]  ?losartan-hydrochlorothiazide (HYZAAR) 100-25 MG tablet Take 1 tablet by mouth daily. 01/05/22  Yes Edwards, Michelle P, NP  ?methocarbamol (ROBAXIN) 500 MG tablet Take 1 tablet (500 mg total) by mouth daily as needed for muscle spasms. 03/18/22  Yes Mound, Haley E, FNP  ?metoprolol tartrate (LOPRESSOR) 25 MG tablet Take 1 tablet (25 mg total) by mouth 2 (two) times daily. 01/05/22  Yes Edwards, Michelle P, NP  ?predniSONE (DELTASONE) 20 MG tablet Take 2 tablets (40 mg total) by mouth daily for 5 days. 04/01/22 04/06/22 Yes Mound, Haley E, FNP  ?SYMBICORT 80-4.5 MCG/ACT inhaler Inhale 2 puffs   into the lungs 2 (two) times daily. 01/05/22  Yes Edwards, Michelle P, NP  ?VENTOLIN HFA 108 (90 Base) MCG/ACT inhaler INHALE 2 PUFFS BY MOUTH EVERY 6 HOURS AS NEEDED FOR WHEEZING FOR SHORTNESS OF BREATH 09/17/21  Yes Edwards, Michelle P, NP  ?ferrous fumarate (HEMOCYTE - 106 MG FE) 325 (106 FE) MG TABS tablet Take 1 tablet by mouth. ?Patient not taking: Reported on 01/05/2022    [provider]  ? ? ?Family History ?Family History  ?Problem Relation Age of Onset  ? Hypertension Mother   ? Diabetes Mother   ? Kidney disease Father   ?     transplant, and subsequent failure   ? Pneumonia Father   ?     died of pneumonia  ? Hypertension  Father   ? Cancer Brother   ?     stomach  ? Alcohol abuse Brother   ? Ulcers Brother   ?     bleeding ulcer related to alcohol abuse  ? Heart disease Neg Hx   ? Stroke Neg Hx   ? Colon cancer Neg Hx   ? ? ?Social History ?Social History  ? ?Tobacco Use  ? Smoking status: Every Day  ?  Packs/day: 0.50  ?  Years: 20.00  ?  Pack years: 10.00  ?  Types: Cigarettes  ? Smokeless tobacco: Never  ?Vaping Use  ? Vaping Use: Never used  ?Substance Use Topics  ? Alcohol use: No  ? Drug use: No  ? ? ? ?Allergies   ?Patient has no known allergies. ? ? ?Review of Systems ?Review of Systems ?Per HPI ? ?Physical Exam ?Triage Vital Signs ?ED Triage Vitals  ?Enc Vitals Group  ?   BP 04/01/22 1238 (!) 169/85  ?   Pulse Rate 04/01/22 1238 85  ?   Resp 04/01/22 1238 18  ?   Temp 04/01/22 1238 98.3 ?F (36.8 ?C)  ?   Temp Source 04/01/22 1238 Oral  ?   SpO2 04/01/22 1238 93 %  ?   Weight 04/01/22 1239 (!) 322 lb 8.5 oz (146.3 kg)  ?   Height 04/01/22 1239 5' 1" (1.549 m)  ?   Head Circumference --   ?   Peak Flow --   ?   Pain Score 04/01/22 1239 7  ?   Pain Loc --   ?   Pain Edu? --   ?   Excl. in GC? --   ? ?No data found. ? ?Updated Vital Signs ?BP (!) 169/85 (BP Location: Left Arm)   Pulse 85   Temp 98.3 ?F (36.8 ?C) (Oral)   Resp 18   Ht 5' 1" (1.549 m)   Wt (!) 322 lb 8.5 oz (146.3 kg)   LMP 07/19/2019 (Approximate)   SpO2 93%   BMI 60.94 kg/m?  ? ?Visual Acuity ?Right Eye Distance:   ?Left Eye Distance:   ?Bilateral Distance:   ? ?Right Eye Near:   ?Left Eye Near:    ?Bilateral Near:    ? ?Physical Exam ?Constitutional:   ?   General: She is not in acute distress. ?   Appearance: Normal appearance. She is not toxic-appearing or diaphoretic.  ?HENT:  ?   Head: Normocephalic and atraumatic.  ?Eyes:  ?   Extraocular Movements: Extraocular movements intact.  ?   Conjunctiva/sclera: Conjunctivae normal.  ?Pulmonary:  ?   Effort: Pulmonary effort is normal.  ?Musculoskeletal:  ?   Comments: Tenderness to palpation to right    anterior knee at right patella.  No crepitus noted.  No obvious swelling or erythema noted.  ?Neurological:  ?   General: No focal deficit present.  ?   Mental Status: She is alert and oriented to person, place, and time. Mental status is at baseline.  ?Psychiatric:     ?   Mood and Affect: Mood normal.     ?   Behavior: Behavior normal.     ?   Thought Content: Thought content normal.     ?   Judgment: Judgment normal.  ? ? ? ?UC Treatments / Results  ?Labs ?(all labs ordered are listed, but only abnormal results are displayed) ?Labs Reviewed - No data to display ? ?EKG ? ? ?Radiology ?DG Knee Complete 4 Views Right ? ?Result Date: 04/01/2022 ?CLINICAL DATA:  Right knee pain for the past 3 days EXAM: RIGHT KNEE - COMPLETE 4+ VIEW COMPARISON:  None. FINDINGS: No acute fracture or malalignment. Tricompartmental degenerative osteoarthritis is evident. Small osteophytes are noted in the lateral compartment along with peaking of the tibial spines. Small suprapatellar knee joint effusion. Scattered atherosclerotic vascular calcifications in the popliteal and runoff arteries. No lytic or blastic osseous lesion. IMPRESSION: 1. Mild-moderate tricompartmental degenerative osteoarthritis. 2. Small suprapatellar knee joint effusion. 3. Atherosclerotic vascular calcifications. Electronically Signed   By: Heath  McCullough M.D.   On: 04/01/2022 13:20   ? ?Procedures ?Procedures (including critical care time) ? ?Medications Ordered in UC ?Medications - No data to display ? ?Initial Impression / Assessment and Plan / UC Course  ?I have reviewed the triage vital signs and the nursing notes. ? ?Pertinent labs & imaging results that were available during my care of the patient were reviewed by me and considered in my medical decision making (see chart for details). ? ?  ? ?Degenerative changes noted in right knee x-ray.  There is atherosclerotic changes as well and patient was notified of this.  Patient advised to follow-up with PCP  for monitoring and further evaluation and management of this.  This appears to be incidental finding.  Will prescribe prednisone steroid to decrease inflammation as patient has not seen relief with muscle relaxers or

## 2022-04-01 NOTE — Discharge Instructions (Signed)
Your x-ray is showing signs of arthritis and degenerative changes.  You have been prescribed prednisone to decrease inflammation associated with this.  Please follow-up with provided contact information for orthopedist for further evaluation and management. ?

## 2022-04-01 NOTE — Telephone Encounter (Signed)
Sent to PCP ?

## 2022-04-01 NOTE — ED Triage Notes (Signed)
Patient c/o right knee pain x 3 days, no injury.  The pain feels like bone on bone, more of rubbing against each other.  Patient taken Aleve for the pain. ?

## 2022-04-02 ENCOUNTER — Other Ambulatory Visit (INDEPENDENT_AMBULATORY_CARE_PROVIDER_SITE_OTHER): Payer: Self-pay | Admitting: Primary Care

## 2022-04-02 MED ORDER — ATORVASTATIN CALCIUM 10 MG PO TABS: 10.0000 mg | ORAL_TABLET | Freq: Every day | ORAL | 3 refills | Status: DC

## 2022-04-03 ENCOUNTER — Encounter: Payer: Self-pay | Admitting: Physician Assistant

## 2022-04-03 ENCOUNTER — Ambulatory Visit: Payer: BC Managed Care – PPO | Admitting: Physician Assistant

## 2022-04-03 DIAGNOSIS — G8929 Other chronic pain: Secondary | ICD-10-CM

## 2022-04-03 DIAGNOSIS — M25561 Pain in right knee: Secondary | ICD-10-CM

## 2022-04-03 NOTE — Progress Notes (Signed)
? ?Office Visit Note ?  ?Patient: [name]           ?Date of Birth: 1961/11/27           ?MRN: [id number] ?Visit Date: 04/03/2022 ?             ?Requested by: Kerin Perna, NP ?[address] ?[place],  [place] [address] ?PCP: Kerin Perna, NP ? ?Chief Complaint  ?Patient presents with  ? Right Knee - Pain  ? ? ? ? ?HPI: ?Patient is a pleasant 61 year old woman with a chief complaint of right knee pain.  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  She is on her feet a lot.  She states that she began having right knee pain a couple days ago.  She acknowledges a little bit of swelling.  She also has had some radicular pain in the past radiating down her leg.  She is here for her knee today.  She remembers having injections in the past and that helping.  She is not a diabetic.  She is currently in the middle of a steroid taper and thinks it is helped ? ?Assessment & Plan: ?Visit Diagnoses:  ?1. Chronic pain of right knee   ? ? ?Plan: Findings consistent with right knee arthritis.  X-ray reflects this.  Since she is improving with the oral taper I told her she should continue this.  Also discussed Voltaren gel.  She has a current BMI of 60 we discussed weight weight loss.  I also gave her a note for work saying she should be able to sit as needed.  She will follow-up in 1 week if she is still having pain we could consider an injection at that time ? ?Ortho Exam ? ?Patient is alert, oriented, no adenopathy, well-dressed, normal affect, normal respiratory effort. ?Right knee she has no warmth no effusion no cellulitis.  She has pain mostly of the patellofemoral joint with some associated grinding.  Good varus valgus stability.  Minimal joint line pain.  Distal compartments are intact as is circulation ? ?Imaging: ?No results found. ?No images are attached to the encounter. ? ?Labs: ?Lab Results  ?Component Value Date  ? HGBA1C 5.6 02/05/2021  ? HGBA1C 5.6 01/31/2014  ? REPTSTATUS 08/12/2007 FINAL 08/09/2007  ?  GRAMSTAIN  08/09/2007  ?  NO WBC SEEN ?FEW SQUAMOUS EPITHELIAL CELLS PRESENT ?NO ORGANISMS SEEN  ? CULT  08/09/2007  ?  MULTIPLE ORGANISMS PRESENT, NONE PREDOMINANT ?Note: NO STAPHYLOCOCCUS AUREUS ISOLATED NO GROUP A STREP (S.PYOGENES) ISOLATED  ? ? ? ?Lab Results  ?Component Value Date  ? ALBUMIN 4.2 01/07/2022  ? ALBUMIN 4.2 02/05/2021  ? ALBUMIN 3.6 08/04/2019  ? ? ?No results found for: MG ?No results found for: VD25OH ? ?No results found for: PREALBUMIN ? ?  Latest Ref Rng & Units 01/07/2022  ?  2:53 PM 02/05/2021  ?  4:25 PM 08/04/2019  ?  1:46 PM  ?CBC EXTENDED  ?WBC 3.4 - 10.8 x10E3/uL 9.5   9.7   10.9    ?RBC 3.77 - 5.28 x10E6/uL 5.24   5.35   5.60    ?Hemoglobin 11.1 - 15.9 g/dL 13.0   13.5   14.2    ?HCT 34.0 - 46.6 % 40.7   41.2   45.4    ?Platelets 150 - 450 x10E3/uL 226   249   198    ?NEUT# 1.4 - 7.0 x10E3/uL 4.9   5.2     ?Lymph# 0.7 - 3.1 x10E3/uL  3.5   3.5     ? ? ? ?There is no height or weight on file to calculate BMI. ? ?Orders:  ?No orders of the defined types were placed in this encounter. ? ?No orders of the defined types were placed in this encounter. ? ? ? Procedures: ?No procedures performed ? ?Clinical Data: ?No additional findings. ? ?ROS: ? ?All other systems negative, except as noted in the HPI. ?Review of Systems ? ?Objective: ?Vital Signs: LMP 07/19/2019 (Approximate)  ? ?Specialty Comments:  ?No specialty comments available. ? ?PMFS History: ?Patient Active Problem List  ? Diagnosis Date Noted  ? Pain in right knee 04/03/2022  ? Obesity, unspecified 01/31/2014  ? TOBACCO USER 09/14/2009  ? SEBACEOUS CYST 06/17/2007  ? 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 DEGENERATIVE JOINT DISEASE, KNEE 05/30/2007  ? 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 BUNIONS, BILATERAL 05/27/2007  ? LEG PAIN, RIGHT 05/27/2007  ? PES PLANUS 05/27/2007  ? HYPERTENSION, BENIGN 02/18/2007  ? BAKERS CYST 02/03/2007  ? ?Past Medical History:  ?Diagnosis Date  ? Hypertension   ? Left leg DVT ([facility]) 2006  ? s/p C-section, 1 month  hospitaliation for staph infection and period of immobility  ? Obesity   ? Staphylococcal infection of skin 2006  ? 1 month hospitalization, s/p C section  ?  ?Family History  ?Problem Relation Age of Onset  ? Hypertension Mother   ? Diabetes Mother   ? Kidney disease Father   ?     transplant, and subsequent failure   ? Pneumonia Father   ?     died of pneumonia  ? Hypertension Father   ? Cancer Brother   ?     stomach  ? Alcohol abuse Brother   ? Ulcers Brother   ?     bleeding ulcer related to alcohol abuse  ? Heart disease Neg Hx   ? Stroke Neg Hx   ? Colon cancer Neg Hx   ?  ?Past Surgical History:  ?Procedure Laterality Date  ? ABDOMINAL EXPLORATION SURGERY  2006  ? staph infection s/p C-section  ? CESAREAN SECTION    ? staph infection s/p childbirth by C-section, then Exploratory abdominal surgery done.  ? TUBAL LIGATION  2006  ? ?Social History  ? ?Occupational History  ? Not on file  ?Tobacco Use  ? Smoking status: Every Day  ?  Packs/day: 0.50  ?  Years: 20.00  ?  Pack years: 10.00  ?  Types: Cigarettes  ? Smokeless tobacco: Never  ?Vaping Use  ? Vaping Use: Never used  ?Substance and Sexual Activity  ? Alcohol use: No  ? Drug use: No  ? Sexual activity: Yes  ? ? ? ? ? ?

## 2022-04-09 ENCOUNTER — Ambulatory Visit (INDEPENDENT_AMBULATORY_CARE_PROVIDER_SITE_OTHER): Payer: BC Managed Care – PPO | Admitting: Primary Care

## 2022-04-10 ENCOUNTER — Ambulatory Visit (INDEPENDENT_AMBULATORY_CARE_PROVIDER_SITE_OTHER): Payer: BC Managed Care – PPO | Admitting: Physician Assistant

## 2022-04-10 DIAGNOSIS — M25561 Pain in right knee: Secondary | ICD-10-CM | POA: Diagnosis not present

## 2022-04-10 DIAGNOSIS — G8929 Other chronic pain: Secondary | ICD-10-CM | POA: Diagnosis not present

## 2022-04-12 ENCOUNTER — Encounter: Payer: Self-pay | Admitting: Physician Assistant

## 2022-04-12 DIAGNOSIS — M25561 Pain in right knee: Secondary | ICD-10-CM

## 2022-04-12 DIAGNOSIS — G8929 Other chronic pain: Secondary | ICD-10-CM

## 2022-04-12 MED ORDER — METHYLPREDNISOLONE ACETATE 40 MG/ML IJ SUSP
80.0000 mg | INTRAMUSCULAR | Status: AC | PRN
  Administered 2022-04-12: 80 mg via INTRA_ARTICULAR

## 2022-04-12 MED ORDER — LIDOCAINE HCL 1 % IJ SOLN
2.0000 mL | INTRAMUSCULAR | Status: AC | PRN
  Administered 2022-04-12: 2 mL

## 2022-04-12 MED ORDER — BUPIVACAINE HCL 0.25 % IJ SOLN
2.0000 mL | INTRAMUSCULAR | Status: AC | PRN
  Administered 2022-04-12: 2 mL via INTRA_ARTICULAR

## 2022-04-12 NOTE — Progress Notes (Signed)
? ?Office Visit Note ?  ?Patient: [name]           ?Date of Birth: 04/18/1961           ?MRN: [id number] ?Visit Date: 04/10/2022 ?             ?Requested by: Grayce Sessions, NP ?[address] ?[place],  [place] [address] ?PCP: Grayce Sessions, NP ? ?No chief complaint on file. ? ? ? ? ?HPI: ?Patient is a pleasant 61 year old woman with a history of right knee pain.  She follows up today requesting an injection into her right knee she has been working hard on weight loss ? ?Assessment & Plan: ?Visit Diagnoses: No diagnosis found. ? ?Plan: Injection was given today into her right knee without difficulty.  May be a candidate for viscosupplementation but will see how she does with the injection ? ?Follow-Up Instructions: No follow-ups on file.  ? ?Ortho Exam ? ?Patient is alert, oriented, no adenopathy, well-dressed, normal affect, normal respiratory effort. ?Right knee no effusion no redness she does have global pain with range of motion ? ?Imaging: ?No results found. ?No images are attached to the encounter. ? ?Labs: ?Lab Results  ?Component Value Date  ? HGBA1C 5.6 02/05/2021  ? HGBA1C 5.6 01/31/2014  ? REPTSTATUS 08/12/2007 FINAL 08/09/2007  ? GRAMSTAIN  08/09/2007  ?  NO WBC SEEN ?FEW SQUAMOUS EPITHELIAL CELLS PRESENT ?NO ORGANISMS SEEN  ? CULT  08/09/2007  ?  MULTIPLE ORGANISMS PRESENT, NONE PREDOMINANT ?Note: NO STAPHYLOCOCCUS AUREUS ISOLATED NO GROUP A STREP (S.PYOGENES) ISOLATED  ? ? ? ?Lab Results  ?Component Value Date  ? ALBUMIN 4.2 01/07/2022  ? ALBUMIN 4.2 02/05/2021  ? ALBUMIN 3.6 08/04/2019  ? ? ?No results found for: MG ?No results found for: VD25OH ? ?No results found for: PREALBUMIN ? ?  Latest Ref Rng & Units 01/07/2022  ?  2:53 PM 02/05/2021  ?  4:25 PM 08/04/2019  ?  1:46 PM  ?CBC EXTENDED  ?WBC 3.4 - 10.8 x10E3/uL 9.5   9.7   10.9    ?RBC 3.77 - 5.28 x10E6/uL 5.24   5.35   5.60    ?Hemoglobin 11.1 - 15.9 g/dL 56.3   87.5   64.3    ?HCT 34.0 - 46.6 % 40.7   41.2   45.4    ?Platelets 150  - 450 x10E3/uL 226   249   198    ?NEUT# 1.4 - 7.0 x10E3/uL 4.9   5.2     ?Lymph# 0.7 - 3.1 x10E3/uL 3.5   3.5     ? ? ? ?There is no height or weight on file to calculate BMI. ? ?Orders:  ?No orders of the defined types were placed in this encounter. ? ?No orders of the defined types were placed in this encounter. ? ? ? Procedures: ?Large Joint Inj: R knee on 04/12/2022 2:42 PM ?Indications: pain and diagnostic evaluation ?Details: 25 G 1.5 in needle ? ?Arthrogram: No ? ?Medications: 80 mg methylPREDNISolone acetate 40 MG/ML; 2 mL lidocaine 1 %; 2 mL bupivacaine 0.25 % ?Outcome: tolerated well, no immediate complications ?Procedure, treatment alternatives, risks and benefits explained, specific risks discussed. Consent was given by the patient.  ? ? ? ?Clinical Data: ?No additional findings. ? ?ROS: ? ?All other systems negative, except as noted in the HPI. ?Review of Systems ? ?Objective: ?Vital Signs: LMP 07/19/2019 (Approximate)  ? ?Specialty Comments:  ?No specialty comments available. ? ?PMFS History: ?Patient Active Problem List  ?  Diagnosis Date Noted  ? Pain in right knee 04/03/2022  ? Obesity, unspecified 01/31/2014  ? TOBACCO USER 09/14/2009  ? SEBACEOUS CYST 06/17/2007  ? 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 DEGENERATIVE JOINT DISEASE, KNEE 05/30/2007  ? 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 BUNIONS, BILATERAL 05/27/2007  ? LEG PAIN, RIGHT 05/27/2007  ? PES PLANUS 05/27/2007  ? HYPERTENSION, BENIGN 02/18/2007  ? BAKERS CYST 02/03/2007  ? ?Past Medical History:  ?Diagnosis Date  ? Hypertension   ? Left leg DVT ([facility]CC) 2006  ? s/p C-section, 1 month hospitaliation for staph infection and period of immobility  ? Obesity   ? Staphylococcal infection of skin 2006  ? 1 month hospitalization, s/p C section  ?  ?Family History  ?Problem Relation Age of Onset  ? Hypertension Mother   ? Diabetes Mother   ? Kidney disease Father   ?     transplant, and subsequent failure   ? Pneumonia Father   ?     died of pneumonia  ? Hypertension  Father   ? Cancer Brother   ?     stomach  ? Alcohol abuse Brother   ? Ulcers Brother   ?     bleeding ulcer related to alcohol abuse  ? Heart disease Neg Hx   ? Stroke Neg Hx   ? Colon cancer Neg Hx   ?  ?Past Surgical History:  ?Procedure Laterality Date  ? ABDOMINAL EXPLORATION SURGERY  2006  ? staph infection s/p C-section  ? CESAREAN SECTION    ? staph infection s/p childbirth by C-section, then Exploratory abdominal surgery done.  ? TUBAL LIGATION  2006  ? ?Social History  ? ?Occupational History  ? Not on file  ?Tobacco Use  ? Smoking status: Every Day  ?  Packs/day: 0.50  ?  Years: 20.00  ?  Pack years: 10.00  ?  Types: Cigarettes  ? Smokeless tobacco: Never  ?Vaping Use  ? Vaping Use: Never used  ?Substance and Sexual Activity  ? Alcohol use: No  ? Drug use: No  ? Sexual activity: Yes  ? ? ? ? ? ?

## 2022-05-07 ENCOUNTER — Ambulatory Visit (INDEPENDENT_AMBULATORY_CARE_PROVIDER_SITE_OTHER): Payer: BC Managed Care – PPO | Admitting: Primary Care

## 2022-05-07 ENCOUNTER — Encounter (INDEPENDENT_AMBULATORY_CARE_PROVIDER_SITE_OTHER): Payer: Self-pay | Admitting: Primary Care

## 2022-05-07 VITALS — BP 141/79 | HR 96 | Temp 98.6°F | Ht 61.0 in | Wt 323.2 lb

## 2022-05-07 DIAGNOSIS — J302 Other seasonal allergic rhinitis: Secondary | ICD-10-CM | POA: Diagnosis not present

## 2022-05-07 DIAGNOSIS — R058 Other specified cough: Secondary | ICD-10-CM

## 2022-05-07 MED ORDER — GUAIFENESIN ER 600 MG PO TB12: 600.0000 mg | ORAL_TABLET | Freq: Two times a day (BID) | ORAL | 1 refills | Status: DC

## 2022-05-07 MED ORDER — FLUTICASONE PROPIONATE 50 MCG/ACT NA SUSP: 2.0000 | Freq: Every day | NASAL | 6 refills | Status: DC

## 2022-05-07 MED ORDER — BENZONATATE 200 MG PO CAPS: 200.0000 mg | ORAL_CAPSULE | Freq: Two times a day (BID) | ORAL | 1 refills | Status: DC | PRN

## 2022-05-07 NOTE — Progress Notes (Signed)
[place]  Artemisia Auvil, is a 61 y.o. female  BLT:[id number]  AQT:[id number]  DOB - Oct 08, 1961  Chief Complaint  Patient presents with   Cough    Gagging productive cough worse at night        Subjective:   Mazey Mantell is a 61 y.o. female here today for a productive cough, headache, nasal congestion only at night. BP elevated due to taking OTC cough medication with no relief. Patient has  No chest pain, No abdominal pain - No Nausea, No new weakness tingling or numbness, shortness of breath  No problems updated.  No Known Allergies  Past Medical History:  Diagnosis Date   Hypertension    Left leg DVT ([place]) 2006   s/p C-section, 1 month hospitaliation for staph infection and period of immobility   Obesity    Staphylococcal infection of skin 2006   1 month hospitalization, s/p C section    Current Outpatient Medications on File Prior to Visit  Medication Sig Dispense Refill   amLODipine (NORVASC) 10 MG tablet Take 1 tablet (10 mg total) by mouth daily. 90 tablet 1   atorvastatin (LIPITOR) 10 MG tablet Take 1 tablet (10 mg total) by mouth daily. 90 tablet 3   Blood Pressure Monitoring KIT 1 Bag by Does not apply route 3 (three) times daily. 1 kit 0   cloNIDine (CATAPRES) 0.2 MG tablet Take 1 tablet (0.2 mg total) by mouth daily. 90 tablet 1   ibuprofen (ADVIL,MOTRIN) 200 MG tablet Take 600 mg by mouth every 6 (six) hours as needed for moderate pain (Pain).      losartan-hydrochlorothiazide (HYZAAR) 100-25 MG tablet Take 1 tablet by mouth daily. 90 tablet 1   methocarbamol (ROBAXIN) 500 MG tablet Take 1 tablet (500 mg total) by mouth daily as needed for muscle spasms. 10 tablet 0   metoprolol tartrate (LOPRESSOR) 25 MG tablet Take 1 tablet (25 mg total) by mouth 2 (two) times daily. 180 tablet 1   SYMBICORT 80-4.5 MCG/ACT inhaler Inhale 2 puffs into the lungs 2 (two) times daily. 11 g 6   VENTOLIN HFA 108 (90 Base) MCG/ACT inhaler INHALE 2 PUFFS BY MOUTH  EVERY 6 HOURS AS NEEDED FOR WHEEZING FOR SHORTNESS OF BREATH 18 g 1   ferrous fumarate (HEMOCYTE - 106 MG FE) 325 (106 FE) MG TABS tablet Take 1 tablet by mouth. (Patient not taking: Reported on 01/05/2022)     No current facility-administered medications on file prior to visit.    Objective:   Vitals:   05/07/22 1617  BP: (!) 141/79  Pulse: 96  Temp: 98.6 F (37 C)  TempSrc: Oral  SpO2: 94%  Weight: (!) 323 lb 3.2 oz (146.6 kg)  Height: '5\' 1"'  (1.549 m)    Exam General appearance : Awake, alert, not in any distress. Speech Clear. Not toxic looking HEENT: Atraumatic and Normocephalic, pupils equally reactive to light and accomodation Neck: Supple, no JVD. No cervical lymphadenopathy.  Chest: Good air entry bilaterally, no added sounds  CVS: S1 S2 regular, no murmurs.  Abdomen: Bowel sounds present, Non tender and not distended with no gaurding, rigidity or rebound. Extremities: B/L Lower Ext shows no edema, both legs are warm to touch Neurology: Awake alert, and oriented X 3, CN II-XII intact, Non focal Skin: No Rash  Data Review Lab Results  Component Value Date   HGBA1C 5.6 02/05/2021   HGBA1C 5.6 01/31/2014    Assessment & Plan  Alayzha was seen today for cough.  Diagnoses and all orders for this visit:  Seasonal allergies -     fluticasone (FLONASE) 50 MCG/ACT nasal spray; Place 2 sprays into both nostrils daily.  Productive cough -     guaiFENesin (MUCINEX) 600 MG 12 hr tablet; Take 1 tablet (600 mg total) by mouth 2 (two) times daily. -     benzonatate (TESSALON) 200 MG capsule; Take 1 capsule (200 mg total) by mouth 2 (two) times daily as needed for cough.    Patient have been counseled extensively about nutrition and exercise. Other issues discussed during this visit include: low cholesterol diet, weight control and daily exercise, foot care, annual eye examinations at [facility], importance of adherence with medications and regular follow-up. We also  discussed long term complications of uncontrolled diabetes and hypertension.   No follow-ups on file.  The patient was given clear instructions to go to ER or return to medical center if symptoms don't improve, worsen or new problems develop. The patient verbalized understanding. The patient was told to call to get lab results if they haven't heard anything in the next week.   This note has been created with Surveyor, quantity. Any transcriptional errors are unintentional.   Kerin Perna, NP 05/07/2022, 4:42 PM

## 2022-06-02 ENCOUNTER — Encounter: Payer: Self-pay | Admitting: Primary Care

## 2022-06-18 ENCOUNTER — Ambulatory Visit (INDEPENDENT_AMBULATORY_CARE_PROVIDER_SITE_OTHER): Payer: BC Managed Care – PPO | Admitting: Primary Care

## 2022-06-24 ENCOUNTER — Ambulatory Visit (INDEPENDENT_AMBULATORY_CARE_PROVIDER_SITE_OTHER): Payer: BC Managed Care – PPO | Admitting: Primary Care

## 2022-06-24 ENCOUNTER — Encounter (INDEPENDENT_AMBULATORY_CARE_PROVIDER_SITE_OTHER): Payer: Self-pay | Admitting: Primary Care

## 2022-06-24 VITALS — BP 144/90 | HR 76 | Temp 98.2°F | Wt 330.0 lb

## 2022-06-24 DIAGNOSIS — F17210 Nicotine dependence, cigarettes, uncomplicated: Secondary | ICD-10-CM

## 2022-06-24 DIAGNOSIS — Z6841 Body Mass Index (BMI) 40.0 and over, adult: Secondary | ICD-10-CM

## 2022-06-24 DIAGNOSIS — I10 Essential (primary) hypertension: Secondary | ICD-10-CM | POA: Diagnosis not present

## 2022-06-24 NOTE — Patient Instructions (Signed)
Low Back Sprain or Strain Rehab Ask your health care provider which exercises are safe for you. Do exercises exactly as told by your health care provider and adjust them as directed. It is normal to feel mild stretching, pulling, tightness, or discomfort as you do these exercises. Stop right away if you feel sudden pain or your pain gets worse. Do not begin these exercises until told by your health care provider. Stretching and range-of-motion exercises These exercises warm up your muscles and joints and improve the movement and flexibility of your back. These exercises also help to relieve pain, numbness, and tingling. Lumbar rotation  Lie on your back on a firm bed or the floor with your knees bent. Straighten your arms out to your sides so each arm forms a 90-degree angle (right angle) with a side of your body. Slowly move (rotate) both of your knees to one side of your body until you feel a stretch in your lower back (lumbar). Try not to let your shoulders lift off the floor. Hold this position for __________ seconds. Tense your abdominal muscles and slowly move your knees back to the starting position. Repeat this exercise on the other side of your body. Repeat __________ times. Complete this exercise __________ times a day. Single knee to chest  Lie on your back on a firm bed or the floor with both legs straight. Bend one of your knees. Use your hands to move your knee up toward your chest until you feel a gentle stretch in your lower back and buttock. Hold your leg in this position by holding on to the front of your knee. Keep your other leg as straight as possible. Hold this position for __________ seconds. Slowly return to the starting position. Repeat with your other leg. Repeat __________ times. Complete this exercise __________ times a day. Prone extension on elbows  Lie on your abdomen on a firm bed or the floor (prone position). Prop yourself up on your elbows. Use your arms  to help lift your chest up until you feel a gentle stretch in your abdomen and your lower back. This will place some of your body weight on your elbows. If this is uncomfortable, try stacking pillows under your chest. Your hips should stay down, against the surface that you are lying on. Keep your hip and back muscles relaxed. Hold this position for __________ seconds. Slowly relax your upper body and return to the starting position. Repeat __________ times. Complete this exercise __________ times a day. Strengthening exercises These exercises build strength and endurance in your back. Endurance is the ability to use your muscles for a long time, even after they get tired. Pelvic tilt This exercise strengthens the muscles that lie deep in the abdomen. Lie on your back on a firm bed or the floor with your legs extended. Bend your knees so they are pointing toward the ceiling and your feet are flat on the floor. Tighten your lower abdominal muscles to press your lower back against the floor. This motion will tilt your pelvis so your tailbone points up toward the ceiling instead of pointing to your feet or the floor. To help with this exercise, you may place a small towel under your lower back and try to push your back into the towel. Hold this position for __________ seconds. Let your muscles relax completely before you repeat this exercise. Repeat __________ times. Complete this exercise __________ times a day. Alternating arm and leg raises  Get on your hands   and knees on a firm surface. If you are on a hard floor, you may want to use padding, such as an exercise mat, to cushion your knees. Line up your arms and legs. Your hands should be directly below your shoulders, and your knees should be directly below your hips. Lift your left leg behind you. At the same time, raise your right arm and straighten it in front of you. Do not lift your leg higher than your hip. Do not lift your arm higher  than your shoulder. Keep your abdominal and back muscles tight. Keep your hips facing the ground. Do not arch your back. Keep your balance carefully, and do not hold your breath. Hold this position for __________ seconds. Slowly return to the starting position. Repeat with your right leg and your left arm. Repeat __________ times. Complete this exercise __________ times a day. Abdominal set with straight leg raise  Lie on your back on a firm bed or the floor. Bend one of your knees and keep your other leg straight. Tense your abdominal muscles and lift your straight leg up, 4-6 inches (10-15 cm) off the ground. Keep your abdominal muscles tight and hold this position for __________ seconds. Do not hold your breath. Do not arch your back. Keep it flat against the ground. Keep your abdominal muscles tense as you slowly lower your leg back to the starting position. Repeat with your other leg. Repeat __________ times. Complete this exercise __________ times a day. Single leg lower with bent knees Lie on your back on a firm bed or the floor. Tense your abdominal muscles and lift your feet off the floor, one foot at a time, so your knees and hips are bent in 90-degree angles (right angles). Your knees should be over your hips and your lower legs should be parallel to the floor. Keeping your abdominal muscles tense and your knee bent, slowly lower one of your legs so your toe touches the ground. Lift your leg back up to return to the starting position. Do not hold your breath. Do not let your back arch. Keep your back flat against the ground. Repeat with your other leg. Repeat __________ times. Complete this exercise __________ times a day. Posture and body mechanics Good posture and healthy body mechanics can help to relieve stress in your body's tissues and joints. Body mechanics refers to the movements and positions of your body while you do your daily activities. Posture is part of body  mechanics. Good posture means: Your spine is in its natural S-curve position (neutral). Your shoulders are pulled back slightly. Your head is not tipped forward (neutral). Follow these guidelines to improve your posture and body mechanics in your everyday activities. Standing  When standing, keep your spine neutral and your feet about hip-width apart. Keep a slight bend in your knees. Your ears, shoulders, and hips should line up. When you do a task in which you stand in one place for a long time, place one foot up on a stable object that is 2-4 inches (5-10 cm) high, such as a footstool. This helps keep your spine neutral. Sitting  When sitting, keep your spine neutral and keep your feet flat on the floor. Use a footrest, if necessary, and keep your thighs parallel to the floor. Avoid rounding your shoulders, and avoid tilting your head forward. When working at a desk or a computer, keep your desk at a height where your hands are slightly lower than your elbows. Slide your   chair under your desk so you are close enough to maintain good posture. When working at a computer, place your monitor at a height where you are looking straight ahead and you do not have to tilt your head forward or downward to look at the screen. Resting When lying down and resting, avoid positions that are most painful for you. If you have pain with activities such as sitting, bending, stooping, or squatting, lie in a position in which your body does not bend very much. For example, avoid curling up on your side with your arms and knees near your chest (fetal position). If you have pain with activities such as standing for a long time or reaching with your arms, lie with your spine in a neutral position and bend your knees slightly. Try the following positions: Lying on your side with a pillow between your knees. Lying on your back with a pillow under your knees. Lifting  When lifting objects, keep your feet at least  shoulder-width apart and tighten your abdominal muscles. Bend your knees and hips and keep your spine neutral. It is important to lift using the strength of your legs, not your back. Do not lock your knees straight out. Always ask for help to lift heavy or awkward objects. This information is not intended to replace advice given to you by your health care provider. Make sure you discuss any questions you have with your health care provider. Document Revised: 02/10/2021 Document Reviewed: 02/10/2021 Elsevier Patient Education  2023 [employer] Inc.  

## 2022-06-24 NOTE — Progress Notes (Signed)
[place]   [name] is a 61 y.o. female presents for hypertension evaluation, Denies shortness of breath, headaches, chest pain or lower extremity edema, sudden onset, vision changes, unilateral weakness, dizziness, paresthesias   Patient reports adherence with medications.  Dietary habits include: monitor sodium intake  Exercise habits include:as tolerated  Family / Social history: mother DM/HTN   Past Medical History:  Diagnosis Date   Hypertension    Left leg DVT ([place]) 2006   s/p C-section, 1 month hospitaliation for staph infection and period of immobility   Obesity    Staphylococcal infection of skin 2006   1 month hospitalization, s/p C section   Past Surgical History:  Procedure Laterality Date   ABDOMINAL EXPLORATION SURGERY  2006   staph infection s/p C-section   CESAREAN SECTION     staph infection s/p childbirth by C-section, then Exploratory abdominal surgery done.   TUBAL LIGATION  2006   No Known Allergies Current Outpatient Medications on File Prior to Visit  Medication Sig Dispense Refill   amLODipine (NORVASC) 10 MG tablet Take 1 tablet (10 mg total) by mouth daily. 90 tablet 1   atorvastatin (LIPITOR) 10 MG tablet Take 1 tablet (10 mg total) by mouth daily. 90 tablet 3   Blood Pressure Monitoring KIT 1 Bag by Does not apply route 3 (three) times daily. 1 kit 0   cloNIDine (CATAPRES) 0.2 MG tablet Take 1 tablet (0.2 mg total) by mouth daily. 90 tablet 1   fluticasone (FLONASE) 50 MCG/ACT nasal spray Place 2 sprays into both nostrils daily. 16 g 6   losartan-hydrochlorothiazide (HYZAAR) 100-25 MG tablet Take 1 tablet by mouth daily. 90 tablet 1   methocarbamol (ROBAXIN) 500 MG tablet Take 1 tablet (500 mg total) by mouth daily as needed for muscle spasms. 10 tablet 0   metoprolol tartrate (LOPRESSOR) 25 MG tablet Take 1 tablet (25 mg total) by mouth 2 (two) times daily. 180 tablet 1   SYMBICORT 80-4.5 MCG/ACT inhaler Inhale 2 puffs  into the lungs 2 (two) times daily. 11 g 6   VENTOLIN HFA 108 (90 Base) MCG/ACT inhaler INHALE 2 PUFFS BY MOUTH EVERY 6 HOURS AS NEEDED FOR WHEEZING FOR SHORTNESS OF BREATH 18 g 1   benzonatate (TESSALON) 200 MG capsule Take 1 capsule (200 mg total) by mouth 2 (two) times daily as needed for cough. (Patient not taking: Reported on 06/24/2022) 20 capsule 1   ferrous fumarate (HEMOCYTE - 106 MG FE) 325 (106 FE) MG TABS tablet Take 1 tablet by mouth. (Patient not taking: Reported on 01/05/2022)     guaiFENesin (MUCINEX) 600 MG 12 hr tablet Take 1 tablet (600 mg total) by mouth 2 (two) times daily. (Patient not taking: Reported on 06/24/2022) 60 tablet 1   ibuprofen (ADVIL,MOTRIN) 200 MG tablet Take 600 mg by mouth every 6 (six) hours as needed for moderate pain (Pain).  (Patient not taking: Reported on 06/24/2022)     No current facility-administered medications on file prior to visit.   Social History   Socioeconomic History   Marital status: Married    Spouse name: Not on file   Number of children: Not on file   Years of education: Not on file   Highest education level: Not on file  Occupational History   Not on file  Tobacco Use   Smoking status: Every Day    Packs/day: 0.50    Years: 20.00    Total pack years: 10.00    Types:  Cigarettes   Smokeless tobacco: Never  Vaping Use   Vaping Use: Never used  Substance and Sexual Activity   Alcohol use: No   Drug use: No   Sexual activity: Yes  Other Topics Concern   Not on file  Social History Narrative   Married, has 3 children, works for [employer], no exercise   Social Determinants of Health   Financial Resource Strain: Not on file  Food Insecurity: Not on file  Transportation Needs: Not on file  Physical Activity: Not on file  Stress: Not on file  Social Connections: Not on file  Intimate Partner Violence: Not on file   Family History  Problem Relation Age of Onset   Hypertension Mother     Diabetes Mother    Kidney disease Father        transplant, and subsequent failure    Pneumonia Father        died of pneumonia   Hypertension Father    Cancer Brother        stomach   Alcohol abuse Brother    Ulcers Brother        bleeding ulcer related to alcohol abuse   Heart disease Neg Hx    Stroke Neg Hx    Colon cancer Neg Hx      OBJECTIVE:  Vitals:   06/24/22 1339 06/24/22 1406  BP: (!) 149/54 (!) 144/90  Pulse: 76   Temp: 98.2 F (36.8 C)   TempSrc: Oral   SpO2: 96%   Weight: (!) 330 lb (149.7 kg)     Physical Exam Vitals reviewed.  Constitutional:      Appearance: She is obese.  HENT:     Head: Normocephalic.     Right Ear: Tympanic membrane and external ear normal.     Left Ear: Tympanic membrane and external ear normal.     Nose: Nose normal.  Eyes:     Extraocular Movements: Extraocular movements intact.     Pupils: Pupils are equal, round, and reactive to light.  Cardiovascular:     Rate and Rhythm: Normal rate and regular rhythm.  Pulmonary:     Effort: Pulmonary effort is normal.     Breath sounds: Normal breath sounds.  Abdominal:     General: Bowel sounds are normal. There is distension.     Palpations: Abdomen is soft.  Musculoskeletal:        General: Normal range of motion.     Cervical back: Normal range of motion and neck supple.  Skin:    General: Skin is warm and dry.  Neurological:     Mental Status: She is alert and oriented to person, place, and time.  Psychiatric:        Mood and Affect: Mood normal.        Behavior: Behavior normal.        Thought Content: Thought content normal.    ROS Comprehensive ROS Pertinent positive and negative noted in HPI   Last 3 Office BP readings: BP Readings from Last 3 Encounters:  06/24/22 (!) 144/90  05/07/22 (!) 141/79  04/01/22 (!) 169/85    BMET    Component Value Date/Time   NA 142 01/07/2022 1453   K 4.0 01/07/2022 1453   CL 102 01/07/2022 1453   CO2 26 01/07/2022 1453    GLUCOSE 85 01/07/2022 1453   GLUCOSE 107 (H) 08/04/2019 1346   BUN 10 01/07/2022 1453   CREATININE 0.90 01/07/2022 1453   CREATININE  0.89 01/31/2014 1612   CALCIUM 9.7 01/07/2022 1453   GFRNONAA >60 08/04/2019 1346   GFRAA >60 08/04/2019 1346    Renal function: CrCl cannot be calculated (Patient's most recent lab result is older than the maximum 21 days allowed.).  Clinical ASCVD: Yes  The 10-year ASCVD risk score (Arnett DK, et al., 2019) is: 13%   Values used to calculate the score:     Age: 47 years     Sex: Female     Is Non-Hispanic African American: Yes     Diabetic: No     Tobacco smoker: Yes     Systolic Blood Pressure: 937 mmHg     Is BP treated: Yes     HDL Cholesterol: 52 mg/dL     Total Cholesterol: 138 mg/dL  ASCVD risk factors include- [place]   ASSESSMENT & PLAN: Tenleigh was seen today for follow-up.  Diagnoses and all orders for this visit:  Morbid obesity ([place]) Treatment with Phentermine encourage to increase exercise and decrease carbs/ calories    Essential hypertension -Counseled on lifestyle modifications for blood pressure control including reduced dietary sodium, increased exercise, weight reduction and adequate sleep. Also, educated patient about the risk for cardiovascular events, stroke and heart attack. Also counseled patient about the importance of medication adherence. If you participate in smoking, it is important to stop using tobacco as this will increase the risks associated with uncontrolled blood pressure.   Goal BP:  For patients younger than 60: Goal BP < 130/80. For patients 60 and older: Goal BP < 140/90. For patients with diabetes: Goal BP < 130/80. Your most recent BP: 144/90  Minimize salt intake. Minimize alcohol intake   This note has been created with Surveyor, quantity. Any transcriptional errors are unintentional.   Kerin Perna, NP 06/30/2022, 11:07 AM

## 2022-08-12 ENCOUNTER — Ambulatory Visit
Admission: EM | Admit: 2022-08-12 | Discharge: 2022-08-12 | Disposition: A | Discharge status: Home / Self Care | Payer: BC Managed Care – PPO | Attending: Physician Assistant | Admitting: Physician Assistant

## 2022-08-12 DIAGNOSIS — J45901 Unspecified asthma with (acute) exacerbation: Secondary | ICD-10-CM

## 2022-08-12 HISTORY — DX: Unspecified asthma, uncomplicated: J45.909

## 2022-08-12 MED ORDER — ALBUTEROL SULFATE (2.5 MG/3ML) 0.083% IN NEBU
2.5000 mg | INHALATION_SOLUTION | Freq: Once | RESPIRATORY_TRACT | Status: AC
  Administered 2022-08-12: 2.5 mg via RESPIRATORY_TRACT

## 2022-08-12 MED ORDER — AZITHROMYCIN 250 MG PO TABS: 250.0000 mg | ORAL_TABLET | Freq: Every day | ORAL | 0 refills | Status: DC

## 2022-08-12 MED ORDER — METHYLPREDNISOLONE SODIUM SUCC 125 MG IJ SOLR
80.0000 mg | Freq: Once | INTRAMUSCULAR | Status: AC
  Administered 2022-08-12: 80 mg via INTRAMUSCULAR

## 2022-08-12 MED ORDER — PREDNISONE 20 MG PO TABS: 40.0000 mg | ORAL_TABLET | Freq: Every day | ORAL | 0 refills | Status: AC

## 2022-08-12 MED ORDER — ALBUTEROL SULFATE (2.5 MG/3ML) 0.083% IN NEBU: 2.5000 mg | INHALATION_SOLUTION | Freq: Four times a day (QID) | RESPIRATORY_TRACT | 0 refills | Status: DC | PRN

## 2022-08-12 NOTE — ED Triage Notes (Signed)
Pt c/o SOB onset ~ Thursday with just a sore throat. Around Saturday had nasal congestion, Sunday body aches and chills. Reports awakening last night gasping for breath. Used her albuterol inhaler. Spo2 in triage was 84% RA with audible wheezing. Initiated albuterol neb spo2 now 96% during treatment. Denies pain.

## 2022-08-12 NOTE — ED Provider Notes (Signed)
[facility]    CSN: [id number] Arrival date & time: 08/12/22  0959      History   Chief Complaint Chief Complaint  Patient presents with   Shortness of Breath    HPI [name] is a 61 y.o. female.   Here today for evaluation of shortness of breath, cough, congestion that started about 6 days ago.  She reports that initially she had sore throat but this is improved.  She feels that her shortness of breath and cough have worsened with time and last night she noted significant dyspnea with even walking to her bathroom.  She does have known asthma and has used inhaler without significant improvement.  She reports she does not have history of hospitalization due to asthma.  She denies any known fever.  Husband that accompanies her has also developed similar symptoms of cough and congestion recently.  The history is provided by the patient.  Shortness of Breath Associated symptoms: cough, sore throat (resolved) and wheezing   Associated symptoms: no abdominal pain, no ear pain, no fever and no vomiting     Past Medical History:  Diagnosis Date   Asthma    Hypertension    Left leg DVT ([place]) 12/07/2004   s/p C-section, 1 month hospitaliation for staph infection and period of immobility   Obesity    Staphylococcal infection of skin 12/07/2004   1 month hospitalization, s/p C section    Patient Active Problem List   Diagnosis Date Noted   Pain in right knee 04/03/2022   Obesity, unspecified 01/31/2014   TOBACCO USER 09/14/2009   SEBACEOUS CYST 06/17/2007   ITBS 06/17/2007   DEGENERATIVE JOINT DISEASE, KNEE 05/30/2007   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   TUBAL LIGATION  2006    OB History   No obstetric history on file.      Home Medications    Prior to Admission medications   Medication Sig Start Date End Date Taking? Authorizing Provider  albuterol (PROVENTIL) (2.5 MG/3ML) 0.083% nebulizer solution Take 3 mLs (2.5 mg total) by nebulization every 6 (six) hours as needed for wheezing or shortness of breath. 08/12/22  Yes Francene Finders, PA-C  azithromycin (ZITHROMAX) 250 MG tablet Take 1 tablet (250 mg total) by mouth daily. Take first 2 tablets together, then 1 every day until finished. 08/12/22  Yes Francene Finders, PA-C  predniSONE (DELTASONE) 20 MG tablet Take 2 tablets (40 mg total) by mouth daily with breakfast for 5 days. 08/12/22 08/17/22 Yes Francene Finders, PA-C  amLODipine (NORVASC) 10 MG tablet Take 1 tablet (10 mg total) by mouth daily. 01/05/22   Kerin Perna, NP  atorvastatin (LIPITOR) 10 MG tablet Take 1 tablet (10 mg total) by mouth daily. 04/02/22   Kerin Perna, NP  benzonatate (TESSALON) 200 MG capsule Take 1 capsule (200 mg total) by mouth 2 (two) times daily as needed for cough. Patient not taking: Reported on 06/24/2022 05/07/22   Kerin Perna, NP  Blood Pressure Monitoring KIT 1 Bag by Does not apply route 3 (three) times daily. 11/17/19  Kerin Perna, NP  cloNIDine (CATAPRES) 0.2 MG tablet Take 1 tablet (0.2 mg total) by mouth daily. 01/05/22   Kerin Perna, NP  ferrous fumarate (HEMOCYTE - 106 MG FE) 325 (106 FE) MG TABS tablet Take 1 tablet by mouth. Patient not taking: Reported on 01/05/2022    [provider]  fluticasone (FLONASE) 50 MCG/ACT nasal spray Place 2 sprays into both nostrils daily. 05/07/22   Kerin Perna, NP  guaiFENesin (MUCINEX) 600 MG 12 hr tablet Take 1 tablet (600 mg total) by mouth 2 (two) times daily. Patient not taking: Reported on 06/24/2022 05/07/22   Kerin Perna, NP  ibuprofen (ADVIL,MOTRIN) 200 MG tablet Take 600 mg by mouth every 6 (six) hours as needed for moderate pain (Pain).  Patient not taking: Reported on 06/24/2022    [provider]  losartan-hydrochlorothiazide (HYZAAR) 100-25 MG tablet Take 1 tablet by mouth daily. 01/05/22   Kerin Perna, NP  methocarbamol (ROBAXIN) 500 MG tablet Take 1 tablet (500 mg total) by mouth daily as needed for muscle spasms. 03/18/22   Teodora Medici, FNP  metoprolol tartrate (LOPRESSOR) 25 MG tablet Take 1 tablet (25 mg total) by mouth 2 (two) times daily. 01/05/22   Kerin Perna, NP  SYMBICORT 80-4.5 MCG/ACT inhaler Inhale 2 puffs into the lungs 2 (two) times daily. 01/05/22   Kerin Perna, NP  VENTOLIN HFA 108 (90 Base) MCG/ACT inhaler INHALE 2 PUFFS BY MOUTH EVERY 6 HOURS AS NEEDED FOR WHEEZING FOR SHORTNESS OF BREATH 09/17/21   Kerin Perna, NP    Family History Family History  Problem Relation Age of Onset   Hypertension Mother    Diabetes Mother    Kidney disease Father        transplant, and subsequent failure    Pneumonia Father        died of pneumonia   Hypertension Father    Cancer Brother        stomach   Alcohol abuse Brother    Ulcers Brother        bleeding ulcer related to alcohol abuse   Heart disease Neg Hx    Stroke Neg Hx    Colon cancer Neg Hx     Social History Social History   Tobacco Use   Smoking status: Every Day    Packs/day: 0.50    Years: 20.00    Total pack years: 10.00    Types: Cigarettes   Smokeless tobacco: Never  Vaping Use   Vaping Use: Never used  Substance Use Topics   Alcohol use: No   Drug use: No     Allergies   Patient has no known allergies.   Review of Systems Review of Systems  Constitutional:  Negative for chills and fever.  HENT:  Positive for congestion and sore throat (resolved). Negative for ear pain.   Eyes:  Negative for discharge and redness.  Respiratory:  Positive for cough, shortness of  breath and wheezing.   Gastrointestinal:  Negative for abdominal pain, diarrhea, nausea and vomiting.     Physical Exam Triage Vital Signs ED Triage Vitals [08/12/22 1102]  Enc Vitals Group     BP (!) 178/109     Pulse Rate 80     Resp (!) 22     Temp 99.1 F (37.3 C)     Temp Source Oral     SpO2 96 %     Weight      Height  Head Circumference      Peak Flow      Pain Score 0     Pain Loc      Pain Edu?      Excl. in [place]?    No data found.  Updated Vital Signs BP (!) 167/100   Pulse 80   Temp 99.1 F (37.3 C) (Oral)   Resp (!) 22   LMP 07/19/2019 (Approximate)   SpO2 96%     Physical Exam Vitals and nursing note reviewed.  Constitutional:      General: She is not in acute distress.    Appearance: Normal appearance. She is not ill-appearing.  HENT:     Head: Normocephalic and atraumatic.     Nose: Congestion present.  Eyes:     Conjunctiva/sclera: Conjunctivae normal.  Cardiovascular:     Rate and Rhythm: Normal rate and regular rhythm.     Heart sounds: Normal heart sounds. No murmur heard. Pulmonary:     Effort: Pulmonary effort is normal. No respiratory distress.     Breath sounds: Wheezing (after nebulizer treatment scattered wheezes noted, not as audible as reported before treatment) present. No rhonchi or rales.  Skin:    General: Skin is warm and dry.  Neurological:     Mental Status: She is alert.  Psychiatric:        Mood and Affect: Mood normal.        Thought Content: Thought content normal.      UC Treatments / Results  Labs (all labs ordered are listed, but only abnormal results are displayed) Labs Reviewed - No data to display  EKG   Radiology No results found.  Procedures Procedures (including critical care time)  Medications Ordered in UC Medications  albuterol (PROVENTIL) (2.5 MG/3ML) 0.083% nebulizer solution 2.5 mg (2.5 mg Nebulization Given 08/12/22 1102)  methylPREDNISolone sodium succinate (SOLU-MEDROL) 125 mg/2  mL injection 80 mg (80 mg Intramuscular Given 08/12/22 1149)    Initial Impression / Assessment and Plan / UC Course  I have reviewed the triage vital signs and the nursing notes.  Pertinent labs & imaging results that were available during my care of the patient were reviewed by me and considered in my medical decision making (see chart for details).    Patient did well with nebulizer treatment in office and oxygen saturation improved.  We did have patient ambulate through the hallway a few times in office to determine how oxygen saturation responded to activity-- lowest oxygen sat noted was 87% but patient reports she feels much better than she did and would prefer not to go to hospital at this time. We will treat with steroid injection with oral steroid burst starting tomorrow and zpak with nebulizer machine given in office and albuterol neb solution prescribed. Strict instruction to report to ED with any worsening symptoms, and patient is agreeable to same.   Final Clinical Impressions(s) / UC Diagnoses   Final diagnoses:  Asthma with acute exacerbation, unspecified asthma severity, unspecified whether persistent   Discharge Instructions   None    ED Prescriptions     Medication Sig Dispense Auth. Provider   predniSONE (DELTASONE) 20 MG tablet Take 2 tablets (40 mg total) by mouth daily with breakfast for 5 days. 10 tablet Ewell Poe F, PA-C   azithromycin (ZITHROMAX) 250 MG tablet Take 1 tablet (250 mg total) by mouth daily. Take first 2 tablets together, then 1 every day until finished. 6 tablet Francene Finders, PA-C   albuterol (  PROVENTIL) (2.5 MG/3ML) 0.083% nebulizer solution Take 3 mLs (2.5 mg total) by nebulization every 6 (six) hours as needed for wheezing or shortness of breath. 75 mL Francene Finders, PA-C      PDMP not reviewed this encounter.   Francene Finders, PA-C 08/12/22 1206

## 2022-09-24 ENCOUNTER — Encounter (INDEPENDENT_AMBULATORY_CARE_PROVIDER_SITE_OTHER): Payer: Self-pay | Admitting: Primary Care

## 2022-09-24 ENCOUNTER — Ambulatory Visit (INDEPENDENT_AMBULATORY_CARE_PROVIDER_SITE_OTHER): Payer: BC Managed Care – PPO | Admitting: Primary Care

## 2022-09-24 VITALS — BP 137/85 | HR 76 | Resp 16 | Wt 333.6 lb

## 2022-09-24 DIAGNOSIS — Z23 Encounter for immunization: Secondary | ICD-10-CM | POA: Diagnosis not present

## 2022-09-24 DIAGNOSIS — I10 Essential (primary) hypertension: Secondary | ICD-10-CM | POA: Diagnosis not present

## 2022-09-24 DIAGNOSIS — Z760 Encounter for issue of repeat prescription: Secondary | ICD-10-CM

## 2022-09-24 DIAGNOSIS — J4520 Mild intermittent asthma, uncomplicated: Secondary | ICD-10-CM

## 2022-09-24 DIAGNOSIS — Z1211 Encounter for screening for malignant neoplasm of colon: Secondary | ICD-10-CM

## 2022-09-24 DIAGNOSIS — Z1231 Encounter for screening mammogram for malignant neoplasm of breast: Secondary | ICD-10-CM

## 2022-09-24 DIAGNOSIS — Z6841 Body Mass Index (BMI) 40.0 and over, adult: Secondary | ICD-10-CM | POA: Diagnosis not present

## 2022-09-24 MED ORDER — CLONIDINE HCL 0.2 MG PO TABS: 0.2000 mg | ORAL_TABLET | Freq: Every day | ORAL | 1 refills | Status: DC

## 2022-09-24 MED ORDER — AMLODIPINE BESYLATE 10 MG PO TABS: 10.0000 mg | ORAL_TABLET | Freq: Every day | ORAL | 1 refills | Status: DC

## 2022-09-24 MED ORDER — VENTOLIN HFA 108 (90 BASE) MCG/ACT IN AERS: INHALATION_SPRAY | RESPIRATORY_TRACT | 1 refills | Status: DC

## 2022-09-24 MED ORDER — LOSARTAN POTASSIUM-HCTZ 100-25 MG PO TABS: 1.0000 | ORAL_TABLET | Freq: Every day | ORAL | 1 refills | Status: DC

## 2022-09-24 MED ORDER — IBUPROFEN 800 MG PO TABS: 800.0000 mg | ORAL_TABLET | Freq: Three times a day (TID) | ORAL | 1 refills | Status: DC | PRN

## 2022-09-24 NOTE — Patient Instructions (Signed)
Calorie Counting for Weight Loss Calories are units of energy. Your body needs a certain number of calories from food to keep going throughout the day. When you eat or drink more calories than your body needs, your body stores the extra calories mostly as fat. When you eat or drink fewer calories than your body needs, your body burns fat to get the energy it needs. Calorie counting means keeping track of how many calories you eat and drink each day. Calorie counting can be helpful if you need to lose weight. If you eat fewer calories than your body needs, you should lose weight. Ask your health care provider what a healthy weight is for you. For calorie counting to work, you will need to eat the right number of calories each day to lose a healthy amount of weight per week. A dietitian can help you figure out how many calories you need in a day and will suggest ways to reach your calorie goal. A healthy amount of weight to lose each week is usually 1-2 lb (0.5-0.9 kg). This usually means that your daily calorie intake should be reduced by 500-750 calories. Eating 1,200-1,500 calories a day can help most women lose weight. Eating 1,500-1,800 calories a day can help most men lose weight. What do I need to know about calorie counting? Work with your health care provider or dietitian to determine how many calories you should get each day. To meet your daily calorie goal, you will need to: Find out how many calories are in each food that you would like to eat. Try to do this before you eat. Decide how much of the food you plan to eat. Keep a food log. Do this by writing down what you ate and how many calories it had. To successfully lose weight, it is important to balance calorie counting with a healthy lifestyle that includes regular activity. Where do I find calorie information?  The number of calories in a food can be found on a Nutrition Facts label. If a food does not have a Nutrition Facts label, try  to look up the calories online or ask your dietitian for help. Remember that calories are listed per serving. If you choose to have more than one serving of a food, you will have to multiply the calories per serving by the number of servings you plan to eat. For example, the label on a package of bread might say that a serving size is 1 slice and that there are 90 calories in a serving. If you eat 1 slice, you will have eaten 90 calories. If you eat 2 slices, you will have eaten 180 calories. How do I keep a food log? After each time that you eat, record the following in your food log as soon as possible: What you ate. Be sure to include toppings, sauces, and other extras on the food. How much you ate. This can be measured in cups, ounces, or number of items. How many calories were in each food and drink. The total number of calories in the food you ate. Keep your food log near you, such as in a pocket-sized notebook or on an app or website on your mobile phone. Some programs will calculate calories for you and show you how many calories you have left to meet your daily goal. What are some portion-control tips? Know how many calories are in a serving. This will help you know how many servings you can have of a certain   food. Use a measuring cup to measure serving sizes. You could also try weighing out portions on a kitchen scale. With time, you will be able to estimate serving sizes for some foods. Take time to put servings of different foods on your favorite plates or in your favorite bowls and cups so you know what a serving looks like. Try not to eat straight from a food's packaging, such as from a bag or box. Eating straight from the package makes it hard to see how much you are eating and can lead to overeating. Put the amount you would like to eat in a cup or on a plate to make sure you are eating the right portion. Use smaller plates, glasses, and bowls for smaller portions and to prevent  overeating. Try not to multitask. For example, avoid watching TV or using your computer while eating. If it is time to eat, sit down at a table and enjoy your food. This will help you recognize when you are full. It will also help you be more mindful of what and how much you are eating. What are tips for following this plan? Reading food labels Check the calorie count compared with the serving size. The serving size may be smaller than what you are used to eating. Check the source of the calories. Try to choose foods that are high in protein, fiber, and vitamins, and low in saturated fat, trans fat, and sodium. Shopping Read nutrition labels while you shop. This will help you make healthy decisions about which foods to buy. Pay attention to nutrition labels for low-fat or fat-free foods. These foods sometimes have the same number of calories or more calories than the full-fat versions. They also often have added sugar, starch, or salt to make up for flavor that was removed with the fat. Make a grocery list of lower-calorie foods and stick to it. Cooking Try to cook your favorite foods in a healthier way. For example, try baking instead of frying. Use low-fat dairy products. Meal planning Use more fruits and vegetables. One-half of your plate should be fruits and vegetables. Include lean proteins, such as chicken, turkey, and fish. Lifestyle Each week, aim to do one of the following: 150 minutes of moderate exercise, such as walking. 75 minutes of vigorous exercise, such as running. General information Know how many calories are in the foods you eat most often. This will help you calculate calorie counts faster. Find a way of tracking calories that works for you. Get creative. Try different apps or programs if writing down calories does not work for you. What foods should I eat?  Eat nutritious foods. It is better to have a nutritious, high-calorie food, such as an avocado, than a food with  few nutrients, such as a bag of potato chips. Use your calories on foods and drinks that will fill you up and will not leave you hungry soon after eating. Examples of foods that fill you up are nuts and nut butters, vegetables, lean proteins, and high-fiber foods such as whole grains. High-fiber foods are foods with more than 5 g of fiber per serving. Pay attention to calories in drinks. Low-calorie drinks include water and unsweetened drinks. The items listed above may not be a complete list of foods and beverages you can eat. Contact a dietitian for more information. What foods should I limit? Limit foods or drinks that are not good sources of vitamins, minerals, or protein or that are high in unhealthy fats. These   include: Candy. Other sweets. Sodas, specialty coffee drinks, alcohol, and juice. The items listed above may not be a complete list of foods and beverages you should avoid. Contact a dietitian for more information. How do I count calories when eating out? Pay attention to portions. Often, portions are much larger when eating out. Try these tips to keep portions smaller: Consider sharing a meal instead of getting your own. If you get your own meal, eat only half of it. Before you start eating, ask for a container and put half of your meal into it. When available, consider ordering smaller portions from the menu instead of full portions. Pay attention to your food and drink choices. Knowing the way food is cooked and what is included with the meal can help you eat fewer calories. If calories are listed on the menu, choose the lower-calorie options. Choose dishes that include vegetables, fruits, whole grains, low-fat dairy products, and lean proteins. Choose items that are boiled, broiled, grilled, or steamed. Avoid items that are buttered, battered, fried, or served with cream sauce. Items labeled as crispy are usually fried, unless stated otherwise. Choose water, low-fat milk,  unsweetened iced tea, or other drinks without added sugar. If you want an alcoholic beverage, choose a lower-calorie option, such as a glass of wine or light beer. Ask for dressings, sauces, and syrups on the side. These are usually high in calories, so you should limit the amount you eat. If you want a salad, choose a garden salad and ask for grilled meats. Avoid extra toppings such as bacon, cheese, or fried items. Ask for the dressing on the side, or ask for olive oil and vinegar or lemon to use as dressing. Estimate how many servings of a food you are given. Knowing serving sizes will help you be aware of how much food you are eating at restaurants. Where to find more information Centers for Disease Control and Prevention: www.cdc.gov [place] Department of Agriculture: myplate.gov Summary Calorie counting means keeping track of how many calories you eat and drink each day. If you eat fewer calories than your body needs, you should lose weight. A healthy amount of weight to lose per week is usually 1-2 lb (0.5-0.9 kg). This usually means reducing your daily calorie intake by 500-750 calories. The number of calories in a food can be found on a Nutrition Facts label. If a food does not have a Nutrition Facts label, try to look up the calories online or ask your dietitian for help. Use smaller plates, glasses, and bowls for smaller portions and to prevent overeating. Use your calories on foods and drinks that will fill you up and not leave you hungry shortly after a meal. This information is not intended to replace advice given to you by your health care provider. Make sure you discuss any questions you have with your health care provider. Document Revised: 01/04/2020 Document Reviewed: 01/04/2020 [employer]vier Patient Education  2023 [employer]  

## 2022-09-27 NOTE — Progress Notes (Signed)
[name]  Corryn Madewell, is a 61 y.o. female  TOI:[id number]  IPJ:[id number]  DOB - [birth year]  Chief Complaint  Patient presents with   Hypertension       Subjective:   [name] is a 61 y.o. morbid obese female here today for a follow up visit for the management of hypertension.. Patient has No headache, No chest pain, No abdominal pain - No Nausea, No new weakness tingling or numbness, No Cough - shortness of breath  No problems updated.  No Known Allergies  Past Medical History:  Diagnosis Date   Asthma    Hypertension    Left leg DVT ([place]) 12/07/2004   s/p C-section, 1 month hospitaliation for staph infection and period of immobility   Obesity    Staphylococcal infection of skin 12/07/2004   1 month hospitalization, s/p C section    Current Outpatient Medications on File Prior to Visit  Medication Sig Dispense Refill   atorvastatin (LIPITOR) 10 MG tablet Take 1 tablet (10 mg total) by mouth daily. 90 tablet 3   Blood Pressure Monitoring KIT 1 Bag by Does not apply route 3 (three) times daily. 1 kit 0   ferrous fumarate (HEMOCYTE - 106 MG FE) 325 (106 FE) MG TABS tablet Take 1 tablet by mouth. (Patient not taking: Reported on 01/05/2022)     fluticasone (FLONASE) 50 MCG/ACT nasal spray Place 2 sprays into both nostrils daily. 16 g 6   methocarbamol (ROBAXIN) 500 MG tablet Take 1 tablet (500 mg total) by mouth daily as needed for muscle spasms. 10 tablet 0   metoprolol tartrate (LOPRESSOR) 25 MG tablet Take 1 tablet (25 mg total) by mouth 2 (two) times daily. 180 tablet 1   SYMBICORT 80-4.5 MCG/ACT inhaler Inhale 2 puffs into the lungs 2 (two) times daily. 11 g 6   No current facility-administered medications on file prior to visit.    Objective:   Vitals:   09/24/22 1516  BP: 137/85  Pulse: 76  Resp: 16  SpO2: 95%  Weight: (!) 333 lb 9.6 oz (151.3 kg)    Exam General appearance : Awake, alert, not in any distress. Speech Clear. Not  toxic looking HEENT: Atraumatic and Normocephalic, pupils equally reactive to light and accomodation Neck: Supple, no JVD. No cervical lymphadenopathy.  Chest: Good air entry bilaterally, no added sounds  CVS: S1 S2 regular, no murmurs.  Abdomen: Bowel sounds present, Non tender and not distended with no gaurding, rigidity or rebound. Extremities: B/L Lower Ext shows no edema, both legs are warm to touch Neurology: Awake alert, and oriented X 3, CN II-XII intact, Non focal Skin: No Rash  Data Review Lab Results  Component Value Date   HGBA1C 5.6 02/05/2021   HGBA1C 5.6 01/31/2014    Assessment & Plan   1. Need for immunization against influenza - Flu Vaccine QUAD 77moIM (Fluarix, Fluzone & Alfiuria Quad PF)  2. Morbid obesity (HVictor Discussed diet and exercise for person with BMI >25. Instructed: You must burn more calories than you eat. Losing 5 percent of your body weight should be considered a success. In the longer term, losing more than 15 percent of your body weight and staying at this weight is an extremely good result. However, keep in mind that even losing 5 percent of your body weight leads to important health benefits, so try not to get discouraged if you're not able to lose more than this. Will recheck weight in 3-6 months.   3. Mild intermittent  asthma without complication To be used as a rescue inhaler only patient understands - VENTOLIN HFA 108 (90 Base) MCG/ACT inhaler; INHALE 2 PUFFS BY MOUTH EVERY 6 HOURS AS NEEDED FOR WHEEZING FOR SHORTNESS OF BREATH  Dispense: 18 g; Refill: 1  4. HYPERTENSION, BENIGN This is very much vast improvement and close to blood pressure goal of  < 130/80 Explained that having normal blood pressure is the goal and medications are helping to get to goal and maintain normal blood pressure. DIET: Limit salt intake, read nutrition labels to check salt content, limit fried and high fatty foods  Avoid using multisymptom OTC cold preparations  that generally contain sudafed which can rise BP. Consult with pharmacist on best cold relief products to use for persons with HTN EXERCISE Discussed incorporating exercise such as walking - 30 minutes most days of the week and can do in 10 minute intervals    - losartan-hydrochlorothiazide (HYZAAR) 100-25 MG tablet; Take 1 tablet by mouth daily.  Dispense: 90 tablet; Refill: 1 - cloNIDine (CATAPRES) 0.2 MG tablet; Take 1 tablet (0.2 mg total) by mouth daily.  Dispense: 90 tablet; Refill: 1 - amLODipine (NORVASC) 10 MG tablet; Take 1 tablet (10 mg total) by mouth daily.  Dispense: 90 tablet; Refill: 1  5. Medication refill - VENTOLIN HFA 108 (90 Base) MCG/ACT inhaler; INHALE 2 PUFFS BY MOUTH EVERY 6 HOURS AS NEEDED FOR WHEEZING FOR SHORTNESS OF BREATH  Dispense: 18 g; Refill: 1 - losartan-hydrochlorothiazide (HYZAAR) 100-25 MG tablet; Take 1 tablet by mouth daily.  Dispense: 90 tablet; Refill: 1 - cloNIDine (CATAPRES) 0.2 MG tablet; Take 1 tablet (0.2 mg total) by mouth daily.  Dispense: 90 tablet; Refill: 1 - amLODipine (NORVASC) 10 MG tablet; Take 1 tablet (10 mg total) by mouth daily.  Dispense: 90 tablet; Refill: 1  6. Colon cancer screening - Ambulatory referral to Gastroenterology  7. Encounter for screening mammogram for malignant neoplasm of breast  - MM DIGITAL SCREENING BILATERAL; Future    Patient have been counseled extensively about nutrition and exercise. Other issues discussed during this visit include: low cholesterol diet, weight control and daily exercise, foot care, annual eye examinations at Ophthalmology, importance of adherence with medications and regular follow-up. We also discussed long term complications of uncontrolled diabetes and hypertension.   Return in about 3 months (around 12/25/2022) for HTN/.  The patient was given clear instructions to go to ER or return to medical center if symptoms don't improve, worsen or new problems develop. The patient verbalized  understanding. The patient was told to call to get lab results if they haven't heard anything in the next week.   This note has been created with Surveyor, quantity. Any transcriptional errors are unintentional.   Kerin Perna, NP 09/27/2022, 5:49 PM

## 2022-10-07 ENCOUNTER — Other Ambulatory Visit (INDEPENDENT_AMBULATORY_CARE_PROVIDER_SITE_OTHER): Payer: Self-pay | Admitting: Primary Care

## 2022-10-07 DIAGNOSIS — Z760 Encounter for issue of repeat prescription: Secondary | ICD-10-CM

## 2022-10-08 NOTE — Telephone Encounter (Signed)
Requested Prescriptions  Pending Prescriptions Disp Refills   metoprolol tartrate (LOPRESSOR) 25 MG tablet [Pharmacy Med Name: Metoprolol Tartrate 25 MG Oral Tablet] 180 tablet 0    Sig: Take 1 tablet by mouth twice daily     Cardiovascular:  Beta Blockers Passed - 10/07/2022  5:18 PM      Passed - Last BP in normal range    BP Readings from Last 1 Encounters:  09/24/22 137/85         Passed - Last Heart Rate in normal range    Pulse Readings from Last 1 Encounters:  09/24/22 76         Passed - Valid encounter within last 6 months    Recent Outpatient Visits           2 weeks ago Need for immunization against influenza   [place], [place], NP   3 months ago Essential hypertension   [place], Michelle P, NP   5 months ago Productive cough   [facility] Kerin Perna, NP   9 months ago Need for shingles vaccine   [facility] Kerin Perna, NP   1 year ago Need for immunization against influenza   [place], [place], NP       Future Appointments             In 2 months Oletta Lamas, [place] Cage, NP [place]

## 2022-10-08 NOTE — Telephone Encounter (Signed)
Requested Prescriptions  Pending Prescriptions Disp Refills   metoprolol tartrate (LOPRESSOR) 25 MG tablet [Pharmacy Med Name: Metoprolol Tartrate 25 MG Oral Tablet] 180 tablet 1    Sig: Take 1 tablet by mouth twice daily     Cardiovascular:  Beta Blockers Passed - 10/07/2022  5:17 PM      Passed - Last BP in normal range    BP Readings from Last 1 Encounters:  09/24/22 137/85         Passed - Last Heart Rate in normal range    Pulse Readings from Last 1 Encounters:  09/24/22 76         Passed - Valid encounter within last 6 months    Recent Outpatient Visits           2 weeks ago Need for immunization against influenza   [place], [place], NP   3 months ago Essential hypertension   [place], Michelle P, NP   5 months ago Productive cough   [facility] Kerin Perna, NP   9 months ago Need for shingles vaccine   [facility] Kerin Perna, NP   1 year ago Need for immunization against influenza   [place], [place], NP       Future Appointments             In 2 months Oletta Lamas, [place] Cage, NP [place]

## 2022-12-15 ENCOUNTER — Ambulatory Visit: Payer: BC Managed Care – PPO

## 2022-12-28 ENCOUNTER — Ambulatory Visit (INDEPENDENT_AMBULATORY_CARE_PROVIDER_SITE_OTHER): Payer: Self-pay | Admitting: Primary Care

## 2022-12-31 ENCOUNTER — Encounter (INDEPENDENT_AMBULATORY_CARE_PROVIDER_SITE_OTHER): Payer: Self-pay | Admitting: Primary Care

## 2022-12-31 ENCOUNTER — Ambulatory Visit (INDEPENDENT_AMBULATORY_CARE_PROVIDER_SITE_OTHER): Payer: 59 | Admitting: Primary Care

## 2022-12-31 DIAGNOSIS — J4520 Mild intermittent asthma, uncomplicated: Secondary | ICD-10-CM | POA: Diagnosis not present

## 2022-12-31 DIAGNOSIS — Z23 Encounter for immunization: Secondary | ICD-10-CM | POA: Diagnosis not present

## 2022-12-31 DIAGNOSIS — Z1211 Encounter for screening for malignant neoplasm of colon: Secondary | ICD-10-CM

## 2022-12-31 DIAGNOSIS — E782 Mixed hyperlipidemia: Secondary | ICD-10-CM | POA: Diagnosis not present

## 2022-12-31 DIAGNOSIS — I10 Essential (primary) hypertension: Secondary | ICD-10-CM | POA: Diagnosis not present

## 2022-12-31 DIAGNOSIS — Z6841 Body Mass Index (BMI) 40.0 and over, adult: Secondary | ICD-10-CM

## 2022-12-31 MED ORDER — ALBUTEROL SULFATE (2.5 MG/3ML) 0.083% IN NEBU: 2.5000 mg | INHALATION_SOLUTION | Freq: Four times a day (QID) | RESPIRATORY_TRACT | 1 refills | Status: DC | PRN

## 2022-12-31 NOTE — Progress Notes (Signed)
Established Patient Office Visit  Subjective   Patient ID: [name]    DOB: 07-28-1961  Age: 62 y.o. MRN: [id number]  HPI  [name] is a 62 y.o. morbid obese female here today for a follow up visit for the management of hypertension and medication refill. Patient has No headache, No chest pain, No abdominal pain - No Nausea, No new weakness tingling or numbness, No Cough .She is experiencing shortness of breath with and without exertion.   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   BUNIONS, BILATERAL 05/27/2007   BAKERS CYST 02/03/2007   ITBS 06/17/2007   LEG PAIN, RIGHT 05/27/2007   PES PLANUS 05/27/2007   Obesity, unspecified 01/31/2014   Pain in right knee 04/03/2022   Resolved Ambulatory Problems    Diagnosis Date Noted   No Resolved Ambulatory Problems   Past Medical History:  Diagnosis Date   Asthma    Hypertension    Left leg DVT ([place]) 12/07/2004   Obesity    Staphylococcal infection of skin 12/07/2004     ROS  Comprehensive ROS Pertinent positive and negative noted in HPI     Objective:   Blood Pressure 125/80   Pulse 82   Respiration 16   Height 5\' 2"  (1.575 m)   Weight (Abnormal) 325 lb 12.8 oz (147.8 kg)   Last Menstrual Period 07/19/2019 (Approximate)   Oxygen Saturation 96%   Body Mass Index 59.59 kg/m    Physical Exam Constitutional:      Comments: Morbid severe   HENT:     Head: Normocephalic.     Right Ear: Tympanic membrane and external ear normal.     Left Ear: Tympanic membrane and external ear normal.     Nose: Nose normal.  Eyes:     Extraocular Movements: Extraocular movements intact.     Conjunctiva/sclera: Conjunctivae normal.  Cardiovascular:     Rate and Rhythm: Normal rate.     Pulses: Normal pulses.     Heart sounds: Normal heart  sounds.  Pulmonary:     Breath sounds: Normal breath sounds.  Abdominal:     General: Bowel sounds are normal. There is distension.     Palpations: Abdomen is soft.  Musculoskeletal:        General: Normal range of motion.     Cervical back: Normal range of motion and neck supple.  Skin:    General: Skin is warm and dry.  Neurological:     Mental Status: She is alert and oriented to person, place, and time.  Psychiatric:        Mood and Affect: Mood normal.        Behavior: Behavior normal.        Thought Content: Thought content normal.        Judgment: Judgment normal.      No results found for any visits on 10/15/22.  The ASCVD Risk score (Arnett DK, et al., 2019) failed to calculate for the following reasons:   The systolic blood pressure is missing   Cannot find a previous HDL lab   Cannot find a previous total cholesterol lab    Assessment & Plan:  Lakera was seen today for hypertension and weight management screening.  Diagnoses and all orders for this visit:  Morbid obesity ([place]) Discussed weight management  plan and discussed alternative medication. She will check with her insurance company to see if they offer anti-obesity coverage and she will follow back up with [name]   Mild intermittent asthma without complication Controlled with Laurence Ferrari using rescue only when needed and Symbicort for maintenance needing refill   Mixed hyperlipidemia Taking Atorovastin 10mg  and cholesterol is controlled  She is participating in Healthy lifestyle diet of fruits vegetables fish nuts whole grains and low saturated fat . Foods high in cholesterol or liver, fatty meats,cheese, butter avocados, nuts and seeds, chocolate and fried foods.    HYPERTENSION, BENIGN BP goal is met - < 130/80 Explained that having normal blood pressure is the goal and medications are helping to get to goal and maintain normal blood pressure. DIET: Limit salt intake, read nutrition labels to check salt content,  limit fried and high fatty foods  Avoid using multisymptom OTC cold preparations that generally contain sudafed which can rise BP. Consult with pharmacist on best cold relief products to use for persons with HTN EXERCISE Discussed incorporating exercise such as walking - 30 minutes most days of the week and can do in 10 minute intervals     Colon cancer screening   Referral was placed back on 09/24/2022. Per patient GI office and patient have been playing phone tag. Contacted Dr. Michail Sermon and per conversation his nurse Lenna Sciara will contact her to get her schedule. No follow-ups on file.    Kerin Perna, NP

## 2023-01-11 ENCOUNTER — Other Ambulatory Visit: Payer: Self-pay | Admitting: Gastroenterology

## 2023-02-04 ENCOUNTER — Ambulatory Visit
Admission: RE | Admit: 2023-02-04 | Discharge: 2023-02-04 | Disposition: A | Discharge status: Home / Self Care | Payer: 59 | Source: Ambulatory Visit | Attending: Primary Care | Admitting: Primary Care

## 2023-02-04 DIAGNOSIS — Z1231 Encounter for screening mammogram for malignant neoplasm of breast: Secondary | ICD-10-CM

## 2023-02-09 ENCOUNTER — Encounter (HOSPITAL_COMMUNITY): Payer: Self-pay | Admitting: Gastroenterology

## 2023-02-09 ENCOUNTER — Other Ambulatory Visit: Payer: Self-pay | Admitting: Primary Care

## 2023-02-09 DIAGNOSIS — R928 Other abnormal and inconclusive findings on diagnostic imaging of breast: Secondary | ICD-10-CM

## 2023-02-09 NOTE — Progress Notes (Signed)
Attempted to obtain medical history via telephone, unable to reach at this time. HIPAA compliant voicemail message left requesting return call to pre surgical testing department. 

## 2023-02-16 ENCOUNTER — Ambulatory Visit (HOSPITAL_COMMUNITY): Payer: No Typology Code available for payment source | Admitting: Anesthesiology

## 2023-02-16 ENCOUNTER — Other Ambulatory Visit: Payer: Self-pay

## 2023-02-16 ENCOUNTER — Ambulatory Visit (HOSPITAL_BASED_OUTPATIENT_CLINIC_OR_DEPARTMENT_OTHER): Payer: No Typology Code available for payment source | Admitting: Anesthesiology

## 2023-02-16 ENCOUNTER — Encounter (HOSPITAL_COMMUNITY)
Admission: RE | Disposition: A | Discharge status: Home / Self Care | Payer: Self-pay | Source: Home / Self Care | Attending: Gastroenterology

## 2023-02-16 ENCOUNTER — Ambulatory Visit (HOSPITAL_COMMUNITY)
Admission: RE | Admit: 2023-02-16 | Discharge: 2023-02-16 | Disposition: A | Discharge status: Home / Self Care | Payer: No Typology Code available for payment source | Attending: Gastroenterology | Admitting: Gastroenterology

## 2023-02-16 ENCOUNTER — Encounter (HOSPITAL_COMMUNITY): Payer: Self-pay | Admitting: Gastroenterology

## 2023-02-16 DIAGNOSIS — I10 Essential (primary) hypertension: Secondary | ICD-10-CM

## 2023-02-16 DIAGNOSIS — F17210 Nicotine dependence, cigarettes, uncomplicated: Secondary | ICD-10-CM | POA: Insufficient documentation

## 2023-02-16 DIAGNOSIS — R195 Other fecal abnormalities: Secondary | ICD-10-CM | POA: Insufficient documentation

## 2023-02-16 DIAGNOSIS — K649 Unspecified hemorrhoids: Secondary | ICD-10-CM

## 2023-02-16 DIAGNOSIS — K635 Polyp of colon: Secondary | ICD-10-CM

## 2023-02-16 DIAGNOSIS — Z6841 Body Mass Index (BMI) 40.0 and over, adult: Secondary | ICD-10-CM | POA: Insufficient documentation

## 2023-02-16 DIAGNOSIS — Z1211 Encounter for screening for malignant neoplasm of colon: Secondary | ICD-10-CM | POA: Insufficient documentation

## 2023-02-16 DIAGNOSIS — K640 First degree hemorrhoids: Secondary | ICD-10-CM | POA: Diagnosis not present

## 2023-02-16 DIAGNOSIS — J45909 Unspecified asthma, uncomplicated: Secondary | ICD-10-CM | POA: Diagnosis not present

## 2023-02-16 HISTORY — PX: COLONOSCOPY WITH PROPOFOL: SHX5780

## 2023-02-16 HISTORY — PX: BIOPSY: SHX5522

## 2023-02-16 HISTORY — PX: POLYPECTOMY: SHX5525

## 2023-02-16 SURGERY — COLONOSCOPY WITH PROPOFOL
Anesthesia: Monitor Anesthesia Care

## 2023-02-16 MED ORDER — PROPOFOL 10 MG/ML IV BOLUS
INTRAVENOUS | Status: DC | PRN
  Administered 2023-02-16: 40 mg via INTRAVENOUS

## 2023-02-16 MED ORDER — SODIUM CHLORIDE 0.9 % IV SOLN: INTRAVENOUS | Status: DC

## 2023-02-16 MED ORDER — LACTATED RINGERS IV SOLN: INTRAVENOUS | Status: DC

## 2023-02-16 MED ORDER — PROPOFOL 500 MG/50ML IV EMUL
INTRAVENOUS | Status: DC | PRN
  Administered 2023-02-16: 150 ug/kg/min via INTRAVENOUS

## 2023-02-16 SURGICAL SUPPLY — 22 items

## 2023-02-16 NOTE — Op Note (Signed)
[facility] Patient Name: [name] Procedure Date: 02/16/2023 MRN: [id number] Attending MD: Lear Ng , MD, [id number] Date of Birth: February 27, 1961 CSN: [id number] Age: 62 Admit Type: Outpatient Procedure:                Colonoscopy Indications:              This is the patient's first colonoscopy, Positive                            Cologuard test Providers:                Lear Ng, MD, Dulcy Fanny, Janee Morn, Technician Referring MD:             Kerin Perna Medicines:                Propofol per Anesthesia, Monitored Anesthesia Care Complications:            No immediate complications. Estimated Blood Loss:     Estimated blood loss was minimal. Procedure:                Pre-Anesthesia Assessment:                           - Prior to the procedure, a History and Physical                            was performed, and patient medications and                            allergies were reviewed. The patient's tolerance of                            previous anesthesia was also reviewed. The risks                            and benefits of the procedure and the sedation                            options and risks were discussed with the patient.                            All questions were answered, and informed consent                            was obtained. Prior Anticoagulants: The patient has                            taken no anticoagulant or antiplatelet agents. ASA                            Grade Assessment: IV - A patient with severe  systemic disease that is a constant threat to life.                            After reviewing the risks and benefits, the patient                            was deemed in satisfactory condition to undergo the                            procedure.                           After obtaining informed consent, the colonoscope                             was passed under direct vision. Throughout the                            procedure, the patient's blood pressure, pulse, and                            oxygen saturations were monitored continuously. The                            [id number] Olympus colonoscope was                            introduced through the anus and advanced to the the                            cecum, identified by appendiceal orifice and                            ileocecal valve. The colonoscopy was performed                            without difficulty. The patient tolerated the                            procedure well. The quality of the bowel                            preparation was adequate and good. The terminal                            ileum, ileocecal valve, appendiceal orifice, and                            rectum were photographed. Scope In: 9:52:08 AM Scope Out: 10:04:17 AM Scope Withdrawal Time: 0 hours 10 minutes 11 seconds  Total Procedure Duration: 0 hours 12 minutes 9 seconds  Findings:      The perianal and digital rectal examinations were normal.      A 10 mm polyp was found in the descending colon. The polyp was  pedunculated. The polyp was removed with a hot snare. Resection and       retrieval were complete. Estimated blood loss: none.      A 2 mm polyp was found in the sigmoid colon. The polyp was semi-sessile.       The polyp was removed with a cold biopsy forceps. Resection and       retrieval were complete. Estimated blood loss was minimal.      Internal hemorrhoids were found during retroflexion. The hemorrhoids       were medium-sized and Grade I (internal hemorrhoids that do not       prolapse).      The terminal ileum appeared normal. Impression:               - One 10 mm polyp in the descending colon, removed                            with a hot snare. Resected and retrieved.                           - One 2 mm polyp in the sigmoid colon, removed  with                            a cold biopsy forceps. Resected and retrieved.                           - Internal hemorrhoids.                           - The examined portion of the ileum was normal. Moderate Sedation:      N/A - MAC procedure Recommendation:           - Patient has a contact number available for                            emergencies. The signs and symptoms of potential                            delayed complications were discussed with the                            patient. Return to normal activities tomorrow.                            Written discharge instructions were provided to the                            patient.                           - High fiber diet.                           - Await pathology results.                           - Repeat colonoscopy for surveillance based on  pathology results. Procedure Code(s):        --- Professional ---                           [phone], Colonoscopy, flexible; with removal of                            tumor(s), polyp(s), or other lesion(s) by snare                            technique                           45380, 95, Colonoscopy, flexible; with biopsy,                            single or multiple Diagnosis Code(s):        --- Professional ---                           R19.5, Other fecal abnormalities                           D12.4, Benign neoplasm of descending colon                           D12.5, Benign neoplasm of sigmoid colon                           K64.0, First degree hemorrhoids CPT copyright 2022 American Medical Association. All rights reserved. The codes documented in this report are preliminary and upon coder review may  be revised to meet current compliance requirements. Lear Ng, MD 02/16/2023 10:16:24 AM This report has been signed electronically. Number of Addenda: 0

## 2023-02-16 NOTE — Anesthesia Procedure Notes (Signed)
Procedure Name: MAC Date/Time: 02/16/2023 9:50 AM  Performed by: Lieutenant Diego, CRNAPre-anesthesia Checklist: Patient identified, Emergency Drugs available, Suction available, Patient being monitored and Timeout performed Patient Re-evaluated:Patient Re-evaluated prior to induction Oxygen Delivery Method: Nasal cannula Preoxygenation: Pre-oxygenation with 100% oxygen Induction Type: IV induction

## 2023-02-16 NOTE — Interval H&P Note (Signed)
History and Physical Interval Note:  02/16/2023 9:41 AM  [name]  has presented today for surgery, with the diagnosis of Positive Cologuard.  The various methods of treatment have been discussed with the patient and family. After consideration of risks, benefits and other options for treatment, the patient has consented to  Procedure(s): COLONOSCOPY WITH PROPOFOL (N/A) as a surgical intervention.  The patient's history has been reviewed, patient examined, no change in status, stable for surgery.  I have reviewed the patient's chart and labs.  Questions were answered to the patient's satisfaction.     Lear Ng

## 2023-02-16 NOTE — H&P (Signed)
Date of Initial H&P: 02/04/23  History reviewed, patient examined, no change in status, stable for surgery.

## 2023-02-16 NOTE — Transfer of Care (Signed)
Immediate Anesthesia Transfer of Care Note  Patient: [name]  Procedure(s) Performed: COLONOSCOPY WITH PROPOFOL POLYPECTOMY  Patient Location: Endoscopy Unit  Anesthesia Type:MAC  Level of Consciousness: awake  Airway & Oxygen Therapy: Patient Spontanous Breathing and Patient connected to nasal cannula oxygen  Post-op Assessment: Report given to RN and Post -op Vital signs reviewed and stable  Post vital signs: Reviewed and stable  Last Vitals:  Vitals Value Taken Time  BP    Temp    Pulse    Resp    SpO2      Last Pain:  Vitals:   02/16/23 0858  TempSrc: Temporal  PainSc: 0-No pain         Complications: No notable events documented.

## 2023-02-16 NOTE — Anesthesia Preprocedure Evaluation (Addendum)
Anesthesia Evaluation  Patient identified by MRN, date of birth, ID band Patient awake    Reviewed: Allergy & Precautions, NPO status , Patient's Chart, lab work & pertinent test results, reviewed documented beta blocker date and time   Airway Mallampati: II  TM Distance: >3 FB Neck ROM: Full    Dental  (+) Dental Advisory Given, Missing,    Pulmonary asthma , Current Smoker   Pulmonary exam normal breath sounds clear to auscultation       Cardiovascular hypertension, Pt. on medications and Pt. on home beta blockers Normal cardiovascular exam Rhythm:Regular Rate:Normal     Neuro/Psych negative neurological ROS     GI/Hepatic negative GI ROS, Neg liver ROS,,,  Endo/Other    Morbid obesity  Renal/GU negative Renal ROS     Musculoskeletal  (+) Arthritis ,    Abdominal  (+) + obese  Peds  Hematology negative hematology ROS (+)   Anesthesia Other Findings   Reproductive/Obstetrics                             Anesthesia Physical Anesthesia Plan  ASA: 4  Anesthesia Plan: MAC   Post-op Pain Management:    Induction:   PONV Risk Score and Plan: 1 and Ondansetron, Propofol infusion, Treatment may vary due to age or medical condition and TIVA  Airway Management Planned: Natural Airway  Additional Equipment: None  Intra-op Plan:   Post-operative Plan:   Informed Consent: I have reviewed the patients History and Physical, chart, labs and discussed the procedure including the risks, benefits and alternatives for the proposed anesthesia with the patient or authorized representative who has indicated his/her understanding and acceptance.     Dental advisory given  Plan Discussed with: CRNA  Anesthesia Plan Comments:        Anesthesia Quick Evaluation

## 2023-02-17 LAB — SURGICAL PATHOLOGY

## 2023-02-17 NOTE — Anesthesia Postprocedure Evaluation (Signed)
Anesthesia Post Note  Patient: [name]  Procedure(s) Performed: COLONOSCOPY WITH PROPOFOL POLYPECTOMY     Patient location during evaluation: PACU Anesthesia Type: MAC Level of consciousness: awake and alert Pain management: pain level controlled Vital Signs Assessment: post-procedure vital signs reviewed and stable Respiratory status: spontaneous breathing Cardiovascular status: stable Anesthetic complications: no   No notable events documented.  Last Vitals:  Vitals:   02/16/23 1018 02/16/23 1028  BP: (!) 146/74 (!) 150/72  Pulse: 71 77  Resp: 20 (!) 22  Temp: 36.5 C   SpO2: 94% 93%    Last Pain:  Vitals:   02/16/23 1028  TempSrc:   PainSc: 0-No pain                 Nolon Nations

## 2023-02-19 ENCOUNTER — Encounter (HOSPITAL_COMMUNITY): Payer: Self-pay | Admitting: Gastroenterology

## 2023-02-26 ENCOUNTER — Other Ambulatory Visit: Payer: Self-pay | Admitting: Primary Care

## 2023-02-26 ENCOUNTER — Ambulatory Visit
Admission: RE | Admit: 2023-02-26 | Discharge: 2023-02-26 | Disposition: A | Discharge status: Home / Self Care | Payer: 59 | Source: Ambulatory Visit | Attending: Primary Care | Admitting: Primary Care

## 2023-02-26 DIAGNOSIS — R928 Other abnormal and inconclusive findings on diagnostic imaging of breast: Secondary | ICD-10-CM

## 2023-02-26 DIAGNOSIS — R921 Mammographic calcification found on diagnostic imaging of breast: Secondary | ICD-10-CM

## 2023-03-11 ENCOUNTER — Ambulatory Visit
Admission: RE | Admit: 2023-03-11 | Discharge: 2023-03-11 | Disposition: A | Discharge status: Home / Self Care | Payer: 59 | Source: Ambulatory Visit | Attending: Primary Care | Admitting: Primary Care

## 2023-03-11 ENCOUNTER — Other Ambulatory Visit (INDEPENDENT_AMBULATORY_CARE_PROVIDER_SITE_OTHER): Payer: Self-pay | Admitting: Primary Care

## 2023-03-11 DIAGNOSIS — R928 Other abnormal and inconclusive findings on diagnostic imaging of breast: Secondary | ICD-10-CM

## 2023-03-11 DIAGNOSIS — R921 Mammographic calcification found on diagnostic imaging of breast: Secondary | ICD-10-CM

## 2023-03-11 HISTORY — PX: BREAST BIOPSY: SHX20

## 2023-04-01 ENCOUNTER — Encounter (INDEPENDENT_AMBULATORY_CARE_PROVIDER_SITE_OTHER): Payer: Self-pay | Admitting: Primary Care

## 2023-04-01 ENCOUNTER — Ambulatory Visit (INDEPENDENT_AMBULATORY_CARE_PROVIDER_SITE_OTHER): Payer: No Typology Code available for payment source | Admitting: Primary Care

## 2023-04-01 DIAGNOSIS — J4520 Mild intermittent asthma, uncomplicated: Secondary | ICD-10-CM | POA: Diagnosis not present

## 2023-04-01 DIAGNOSIS — Z760 Encounter for issue of repeat prescription: Secondary | ICD-10-CM

## 2023-04-01 DIAGNOSIS — I10 Essential (primary) hypertension: Secondary | ICD-10-CM | POA: Diagnosis not present

## 2023-04-01 MED ORDER — LOSARTAN POTASSIUM-HCTZ 100-25 MG PO TABS: 1.0000 | ORAL_TABLET | Freq: Every day | ORAL | 1 refills | Status: DC

## 2023-04-01 MED ORDER — FLUTICASONE PROPIONATE HFA 110 MCG/ACT IN AERO: 2.0000 | INHALATION_SPRAY | Freq: Every day | RESPIRATORY_TRACT | 12 refills | Status: DC

## 2023-04-01 MED ORDER — VENTOLIN HFA 108 (90 BASE) MCG/ACT IN AERS: INHALATION_SPRAY | RESPIRATORY_TRACT | 1 refills | Status: DC

## 2023-04-01 MED ORDER — METOPROLOL TARTRATE 25 MG PO TABS: 25.0000 mg | ORAL_TABLET | Freq: Two times a day (BID) | ORAL | 0 refills | Status: DC

## 2023-04-01 MED ORDER — AMLODIPINE BESYLATE 10 MG PO TABS: 10.0000 mg | ORAL_TABLET | Freq: Every day | ORAL | 1 refills | Status: DC

## 2023-04-01 MED ORDER — CLONIDINE HCL 0.2 MG PO TABS: 0.2000 mg | ORAL_TABLET | Freq: Every day | ORAL | 1 refills | Status: DC

## 2023-04-03 NOTE — Progress Notes (Signed)
[facility]  [name], is a 62 y.o. female  AVW:[id number]  WGN:[id number]  DOB - Nov 01, 1961  Chief Complaint  Patient presents with   Hypertension   Weight Management Screening       Subjective:   [name] is a 62 y.o. female here today for a follow up visit. Patient has No headache, No chest pain, No abdominal pain - No Nausea, No new weakness tingling or numbness, No Cough - shortness of breath  No problems updated.  No Known Allergies  Past Medical History:  Diagnosis Date   Asthma    Hypertension    Left leg DVT (HCC) 12/07/2004   s/p C-section, 1 month hospitaliation for staph infection and period of immobility   Obesity    Staphylococcal infection of skin 12/07/2004   1 month hospitalization, s/p C section    Current Outpatient Medications on File Prior to Visit  Medication Sig Dispense Refill   albuterol (PROVENTIL) (2.5 MG/3ML) 0.083% nebulizer solution Take 3 mLs (2.5 mg total) by nebulization every 6 (six) hours as needed for wheezing or shortness of breath. 150 mL 1   atorvastatin (LIPITOR) 10 MG tablet Take 1 tablet (10 mg total) by mouth daily. 90 tablet 3   Blood Pressure Monitoring KIT 1 Bag by Does not apply route 3 (three) times daily. 1 kit 0   fluticasone (FLONASE) 50 MCG/ACT nasal spray Place 2 sprays into both nostrils daily. (Patient not taking: Reported on 02/15/2023) 16 g 6   ibuprofen (ADVIL) 800 MG tablet TAKE 1 TABLET BY MOUTH EVERY 8 HOURS AS NEEDED 90 tablet 0   methocarbamol (ROBAXIN) 500 MG tablet Take 1 tablet (500 mg total) by mouth daily as needed for muscle spasms. (Patient not taking: Reported on 02/15/2023) 10 tablet 0   No current facility-administered medications on file prior to visit.    Objective:   Vitals:   04/01/23 1614  BP: (Abnormal) 148/84  Pulse: 85  Resp: 16  SpO2: 97%  Weight: (Abnormal) 327 lb 9.6 oz (148.6 kg)    Comprehensive ROS Pertinent positive and negative noted in HPI    Exam General appearance : Awake, alert, not in any distress. Speech Clear. Not toxic looking HEENT: Atraumatic and Normocephalic, pupils equally reactive to light and accomodation Neck: Supple, no JVD. No cervical lymphadenopathy.  Chest: Good air entry bilaterally, no added sounds  CVS: S1 S2 regular, no murmurs.  Abdomen: Bowel sounds present, Non tender and not distended with no gaurding, rigidity or rebound. Extremities: B/L Lower Ext shows no edema, both legs are warm to touch Neurology: Awake alert, and oriented X 3, CN II-XII intact, Non focal Skin: No Rash  Data Review Lab Results  Component Value Date   HGBA1C 5.6 02/05/2021   HGBA1C 5.6 01/31/2014    Assessment & Plan  [place] was seen today for hypertension and weight management screening.  Diagnoses and all orders for this visit:  HYPERTENSION, BENIGN BP goal - < 130/80 Explained that having normal blood pressure is the goal and medications are helping to get to goal and maintain normal blood pressure. DIET: Limit salt intake, read nutrition labels to check salt content, limit fried and high fatty foods  Avoid using multisymptom OTC cold preparations that generally contain sudafed which can rise BP. Consult with pharmacist on best cold relief products to use for persons with HTN EXERCISE Discussed incorporating exercise such as walking - 30 minutes most days of the week and can do in 10 minute intervals    -  amLODipine (NORVASC) 10 MG tablet; Take 1 tablet (10 mg total) by mouth daily. -     cloNIDine (CATAPRES) 0.2 MG tablet; Take 1 tablet (0.2 mg total) by mouth daily. -     losartan-hydrochlorothiazide (HYZAAR) 100-25 MG tablet; Take 1 tablet by mouth daily.  Medication refill -     amLODipine (NORVASC) 10 MG tablet; Take 1 tablet (10 mg total) by mouth daily. -     cloNIDine (CATAPRES) 0.2 MG tablet; Take 1 tablet (0.2 mg total) by mouth daily. -     losartan-hydrochlorothiazide (HYZAAR) 100-25 MG tablet;  Take 1 tablet by mouth daily. -     metoprolol tartrate (LOPRESSOR) 25 MG tablet; Take 1 tablet (25 mg total) by mouth 2 (two) times daily. -     VENTOLIN HFA 108 (90 Base) MCG/ACT inhaler; INHALE 2 PUFFS BY MOUTH EVERY 6 HOURS AS NEEDED FOR WHEEZING FOR SHORTNESS OF BREATH  Mild intermittent asthma without complication -     VENTOLIN HFA 108 (90 Base) MCG/ACT inhaler; INHALE 2 PUFFS BY MOUTH EVERY 6 HOURS AS NEEDED FOR WHEEZING FOR SHORTNESS OF BREATH -     fluticasone (FLOVENT HFA) 110 MCG/ACT inhaler; Inhale 2 puffs into the lungs daily. Patient have been counseled extensively about nutrition and exercise. Other issues discussed during this visit include: low cholesterol diet, weight control and daily exercise, foot care, annual eye examinations at Ophthalmology, importance of adherence with medications and regular follow-up. We also discussed long term complications of uncontrolled diabetes and hypertension.   No follow-ups on file.  The patient was given clear instructions to go to ER or return to medical center if symptoms don't improve, worsen or new problems develop. The patient verbalized understanding. The patient was told to call to get lab results if they haven't heard anything in the next week.   This note has been created with Education officer, environmental. Any transcriptional errors are unintentional.   Grayce Sessions, NP 04/03/2023, 9:54 PM

## 2023-04-05 ENCOUNTER — Other Ambulatory Visit: Payer: Self-pay

## 2023-04-06 ENCOUNTER — Other Ambulatory Visit: Payer: Self-pay | Admitting: Pharmacist

## 2023-04-06 MED ORDER — PULMICORT FLEXHALER 90 MCG/ACT IN AEPB: 2.0000 | INHALATION_SPRAY | Freq: Two times a day (BID) | RESPIRATORY_TRACT | 3 refills | Status: AC

## 2023-04-07 ENCOUNTER — Other Ambulatory Visit: Payer: Self-pay

## 2023-04-10 ENCOUNTER — Other Ambulatory Visit (INDEPENDENT_AMBULATORY_CARE_PROVIDER_SITE_OTHER): Payer: Self-pay | Admitting: Primary Care

## 2023-06-09 ENCOUNTER — Other Ambulatory Visit (INDEPENDENT_AMBULATORY_CARE_PROVIDER_SITE_OTHER): Payer: Self-pay | Admitting: Primary Care

## 2023-06-09 DIAGNOSIS — Z760 Encounter for issue of repeat prescription: Secondary | ICD-10-CM

## 2023-06-09 DIAGNOSIS — J4520 Mild intermittent asthma, uncomplicated: Secondary | ICD-10-CM

## 2023-06-09 NOTE — Telephone Encounter (Signed)
Requested medication (s) are due for refill today: Yes  Requested medication (s) are on the active medication list: Yes  Last refill:  04/01/23  Future visit scheduled: No  Notes to clinic:  Pharmacy requests different medication due to insurance.    Requested Prescriptions  Pending Prescriptions Disp Refills   VENTOLIN HFA 108 (90 Base) MCG/ACT inhaler [Pharmacy Med Name: VENTOLIN HFA        AER] 18 g 1    Sig: INHALE 2 PUFFS BY MOUTH EVERY 6 HOURS AS NEEDED FOR WHEEZING OR SHORTNESS OF BREATH     Pulmonology:  Beta Agonists 2 Failed - 06/09/2023  9:36 AM      Failed - Last BP in normal range    BP Readings from Last 1 Encounters:  04/01/23 (!) 148/84         Passed - Last Heart Rate in normal range    Pulse Readings from Last 1 Encounters:  04/01/23 85         Passed - Valid encounter within last 12 months    Recent Outpatient Visits           2 months ago HYPERTENSION, BENIGN   [place] [facility] Grayce Sessions, NP   5 months ago Morbid obesity [facility])   [place] [facility] Grayce Sessions, NP   8 months ago Need for immunization against influenza   [place] [facility] Grayce Sessions, NP   11 months ago Essential hypertension   [place] [facility] Grayce Sessions, NP   1 year ago Productive cough   [place] [facility] Grayce Sessions, NP

## 2023-06-11 ENCOUNTER — Other Ambulatory Visit (INDEPENDENT_AMBULATORY_CARE_PROVIDER_SITE_OTHER): Payer: Self-pay | Admitting: Primary Care

## 2023-06-17 ENCOUNTER — Ambulatory Visit
Admission: EM | Admit: 2023-06-17 | Discharge: 2023-06-17 | Disposition: A | Discharge status: Home / Self Care | Payer: 59 | Attending: Family Medicine | Admitting: Family Medicine

## 2023-06-17 ENCOUNTER — Ambulatory Visit (INDEPENDENT_AMBULATORY_CARE_PROVIDER_SITE_OTHER): Payer: Self-pay

## 2023-06-17 DIAGNOSIS — J441 Chronic obstructive pulmonary disease with (acute) exacerbation: Secondary | ICD-10-CM | POA: Diagnosis not present

## 2023-06-17 MED ORDER — PREDNISONE 20 MG PO TABS: 40.0000 mg | ORAL_TABLET | Freq: Every day | ORAL | 0 refills | Status: DC

## 2023-06-17 MED ORDER — DOXYCYCLINE HYCLATE 100 MG PO CAPS: 100.0000 mg | ORAL_CAPSULE | Freq: Two times a day (BID) | ORAL | 0 refills | Status: DC

## 2023-06-17 MED ORDER — IPRATROPIUM-ALBUTEROL 0.5-2.5 (3) MG/3ML IN SOLN: 3.0000 mL | Freq: Four times a day (QID) | RESPIRATORY_TRACT | 0 refills | Status: DC | PRN

## 2023-06-17 MED ORDER — PROMETHAZINE-DM 6.25-15 MG/5ML PO SYRP: 5.0000 mL | ORAL_SOLUTION | Freq: Four times a day (QID) | ORAL | 0 refills | Status: DC | PRN

## 2023-06-17 MED ORDER — ALBUTEROL SULFATE HFA 108 (90 BASE) MCG/ACT IN AERS
2.0000 | INHALATION_SPRAY | RESPIRATORY_TRACT | Status: DC
  Administered 2023-06-17: 2 via RESPIRATORY_TRACT

## 2023-06-17 NOTE — ED Provider Notes (Signed)
[facility]    CSN: [id number] Arrival date & time: 06/17/23  1039      History   Chief Complaint Chief Complaint  Patient presents with   Sore    HPI [name] is a 62 y.o. female.   HPI Patient here today with a complaint of wheezing and coughing x 1 week. Patient is a daily tobacco user.  Patient has a history of asthma and is a daily smoker.  She recently had her long-acting inhaler change due to insurance restrictions.  She was previously prescribed Pulmicort and is now prescribed Advair and reports she has had a hard time using the Advair cartridges.  She endorses shortness of breath at rest but has also noticed some worsening shortness of breath with activity since the onset of symptoms 1 week ago.  Reports wheezing and shortness of breath have worsened over the last 72 hours.  She denies any upper respiratory symptoms but reports last week she did have some nasal congestion which precipitated into her current symptoms.  Cough is nonproductive. She has administered nebulized since the onset of symptoms and reports she feels better temporarily and subsequently the wheezing and shortness of breath returns.  She has had no fever, body aches, nausea or vomiting.  Denies any known sick contacts Past Medical History:  Diagnosis Date   Asthma    Hypertension    Left leg DVT (HCC) 12/07/2004   s/p C-section, 1 month hospitaliation for staph infection and period of immobility   Obesity    Staphylococcal infection of skin 12/07/2004   1 month hospitalization, s/p C section    Patient Active Problem List   Diagnosis Date Noted   Positive colorectal cancer screening using Cologuard test 02/16/2023   Pain in right knee 04/03/2022   Obesity, unspecified 01/31/2014   TOBACCO USER 09/14/2009   SEBACEOUS CYST 06/17/2007   ITBS 06/17/2007   DEGENERATIVE JOINT DISEASE, KNEE 05/30/2007   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   BUNIONS, BILATERAL 05/27/2007   LEG PAIN,  RIGHT 05/27/2007   PES PLANUS 05/27/2007   HYPERTENSION, BENIGN 02/18/2007   BAKERS CYST 02/03/2007    Past Surgical History:  Procedure Laterality Date   ABDOMINAL EXPLORATION SURGERY  2006   staph infection s/p C-section   BIOPSY  02/16/2023   Procedure: BIOPSY;  Surgeon: Charlott Rakes, MD;  Location: [facility];  Service: Gastroenterology;;   BREAST BIOPSY Left 03/11/2023   MM LT BREAST BX W LOC DEV 1ST LESION IMAGE BX SPEC STEREO GUIDE 03/11/2023 [facility]   CESAREAN SECTION     staph infection s/p childbirth by C-section, then Exploratory abdominal surgery done.   COLONOSCOPY WITH PROPOFOL N/A 02/16/2023   Procedure: COLONOSCOPY WITH PROPOFOL;  Surgeon: Charlott Rakes, MD;  Location: [facility];  Service: Gastroenterology;  Laterality: N/A;   POLYPECTOMY  02/16/2023   Procedure: POLYPECTOMY;  Surgeon: Charlott Rakes, MD;  Location: [facility];  Service: Gastroenterology;;   TUBAL LIGATION  2006    OB History   No obstetric history on file.      Home Medications    Prior to Admission medications   Medication Sig Start Date End Date Taking? Authorizing Provider  doxycycline (VIBRAMYCIN) 100 MG capsule Take 1 capsule (100 mg total) by mouth 2 (two) times daily. 06/17/23  Yes Bing Neighbors, NP  ipratropium-albuterol (DUONEB) 0.5-2.5 (3) MG/3ML SOLN Take 3 mLs by nebulization every 6 (six) hours as needed. 06/17/23  Yes Bing Neighbors, NP  predniSONE (DELTASONE) 20 MG  tablet Take 2 tablets (40 mg total) by mouth daily with breakfast. 06/17/23  Yes Bing Neighbors, NP  promethazine-dextromethorphan (PROMETHAZINE-DM) 6.25-15 MG/5ML syrup Take 5 mLs by mouth 4 (four) times daily as needed for cough. 06/17/23  Yes Bing Neighbors, NP  albuterol (PROVENTIL) (2.5 MG/3ML) 0.083% nebulizer solution Take 3 mLs (2.5 mg total) by nebulization every 6 (six) hours as needed for wheezing or shortness of breath. 12/31/22   Grayce Sessions, NP  amLODipine  (NORVASC) 10 MG tablet Take 1 tablet (10 mg total) by mouth daily. 04/01/23   Grayce Sessions, NP  atorvastatin (LIPITOR) 10 MG tablet Take 1 tablet by mouth once daily 06/11/23   Grayce Sessions, NP  Blood Pressure Monitoring KIT 1 Bag by Does not apply route 3 (three) times daily. 11/17/19   Grayce Sessions, NP  Budesonide (PULMICORT FLEXHALER) 90 MCG/ACT inhaler Inhale 2 puffs into the lungs 2 (two) times daily. 04/06/23   Hoy Register, MD  cloNIDine (CATAPRES) 0.2 MG tablet Take 1 tablet (0.2 mg total) by mouth daily. 04/01/23   Grayce Sessions, NP  fluticasone (FLONASE) 50 MCG/ACT nasal spray Place 2 sprays into both nostrils daily. Patient not taking: Reported on 02/15/2023 05/07/22   Grayce Sessions, NP  ibuprofen (ADVIL) 800 MG tablet TAKE 1 TABLET BY MOUTH EVERY 8 HOURS AS NEEDED 03/11/23   Grayce Sessions, NP  losartan-hydrochlorothiazide (HYZAAR) 100-25 MG tablet Take 1 tablet by mouth daily. 04/01/23   Grayce Sessions, NP  methocarbamol (ROBAXIN) 500 MG tablet Take 1 tablet (500 mg total) by mouth daily as needed for muscle spasms. Patient not taking: Reported on 02/15/2023 03/18/22   Gustavus Bryant, FNP  metoprolol tartrate (LOPRESSOR) 25 MG tablet Take 1 tablet (25 mg total) by mouth 2 (two) times daily. 04/01/23   Grayce Sessions, NP  VENTOLIN HFA 108 (90 Base) MCG/ACT inhaler INHALE 2 PUFFS BY MOUTH EVERY 6 HOURS AS NEEDED FOR WHEEZING OR SHORTNESS OF BREATH 06/09/23   Grayce Sessions, NP    Family History Family History  Problem Relation Age of Onset   Hypertension Mother    Diabetes Mother    Kidney disease Father        transplant, and subsequent failure    Pneumonia Father        died of pneumonia   Hypertension Father    Cancer Brother        stomach   Alcohol abuse Brother    Ulcers Brother        bleeding ulcer related to alcohol abuse   Heart disease Neg Hx    Stroke Neg Hx    Colon cancer Neg Hx     Social History Social History    Tobacco Use   Smoking status: Every Day    Current packs/day: 0.50    Average packs/day: 0.5 packs/day for 20.0 years (10.0 ttl pk-yrs)    Types: Cigarettes   Smokeless tobacco: Never  Vaping Use   Vaping status: Never Used  Substance Use Topics   Alcohol use: No   Drug use: No     Allergies   Patient has no known allergies.   Review of Systems Review of Systems Pertinent negatives listed in HPI   Physical Exam Triage Vital Signs ED Triage Vitals  Encounter Vitals Group     BP 06/17/23 1056 (!) 145/80     Systolic BP Percentile --      Diastolic BP Percentile --  Pulse Rate 06/17/23 1056 74     Resp 06/17/23 1056 17     Temp 06/17/23 1056 98.3 F (36.8 C)     Temp Source 06/17/23 1056 Oral     SpO2 06/17/23 1056 92 %     Weight --      Height --      Head Circumference --      Peak Flow --      Pain Score 06/17/23 1055 0     Pain Loc --      Pain Education --      Exclude from Growth Chart --    No data found.  Updated Vital Signs BP (!) 145/80 (BP Location: Left Arm)   Pulse 74   Temp 98.3 F (36.8 C) (Oral)   Resp 17   LMP 07/19/2019 (Approximate)   SpO2 92%   Visual Acuity Right Eye Distance:   Left Eye Distance:   Bilateral Distance:    Right Eye Near:   Left Eye Near:    Bilateral Near:     Physical Exam Vitals reviewed.  Constitutional:      General: She is not in acute distress.    Appearance: She is obese. She is not ill-appearing.  HENT:     Head: Normocephalic and atraumatic.     Right Ear: There is no impacted cerumen.     Left Ear: There is no impacted cerumen.     Nose: No congestion or rhinorrhea.  Eyes:     Extraocular Movements: Extraocular movements intact.     Pupils: Pupils are equal, round, and reactive to light.  Cardiovascular:     Rate and Rhythm: Normal rate and regular rhythm.  Pulmonary:     Breath sounds: Decreased air movement present. Examination of the right-upper field reveals wheezing. Examination  of the left-upper field reveals wheezing. Examination of the right-middle field reveals wheezing. Examination of the left-middle field reveals wheezing. Wheezing present.  Musculoskeletal:        General: Normal range of motion.     Cervical back: Normal range of motion.  Lymphadenopathy:     Cervical: No cervical adenopathy.  Skin:    General: Skin is warm and dry.  Neurological:     General: No focal deficit present.     Mental Status: She is alert.      UC Treatments / Results  Labs (all labs ordered are listed, but only abnormal results are displayed) Labs Reviewed - No data to display  EKG   Radiology No results found.  Procedures Procedures (including critical care time)  Medications Ordered in UC Medications  albuterol (VENTOLIN HFA) 108 (90 Base) MCG/ACT inhaler 2 puff (2 puffs Inhalation Given 06/17/23 1128)    Initial Impression / Assessment and Plan / UC Course  I have reviewed the triage vital signs and the nursing notes.  Pertinent labs & imaging results that were available during my care of the patient were reviewed by me and considered in my medical decision making (see chart for details).    Chronic obstructive bronchitis exacerbation, treatment per discharge medication orders.  Given body habitus unable to rule out a possible pneumonia although auscultated diffuse wheezing therefore will obtain a chest x-ray to rule out the presence of any acute cardiopulmonary disease present.  X-ray imaging not available today onsite therefore an outpatient x-ray has been ordered.  Patient advised she will obtain within the next 72 hours. Final Clinical Impressions(s) / UC Diagnoses   Final diagnoses:  Bronchitis, chronic obstructive, with exacerbation [facility])     Discharge Instructions      Follow-up with PCP to discuss long-acting inhaler. My system does not indicate coverage for [facility] or Symbicort with current insurance plan. PCP may need to complete a prior  authorization.  Take all medication as prescribed. If your symptoms worsen or you experience worsening difficulty breathing, go to the nearest Emergency Department.   Go to one of our outpatient imaging sites to have a chest x-ray completed.      ED Prescriptions     Medication Sig Dispense Auth. Provider   predniSONE (DELTASONE) 20 MG tablet Take 2 tablets (40 mg total) by mouth daily with breakfast. 10 tablet Bing Neighbors, NP   promethazine-dextromethorphan (PROMETHAZINE-DM) 6.25-15 MG/5ML syrup Take 5 mLs by mouth 4 (four) times daily as needed for cough. 240 mL Bing Neighbors, NP   ipratropium-albuterol (DUONEB) 0.5-2.5 (3) MG/3ML SOLN Take 3 mLs by nebulization every 6 (six) hours as needed. 360 mL Bing Neighbors, NP   doxycycline (VIBRAMYCIN) 100 MG capsule Take 1 capsule (100 mg total) by mouth 2 (two) times daily. 20 capsule Bing Neighbors, NP      PDMP not reviewed this encounter.   Bing Neighbors, NP 06/17/23 1143

## 2023-06-17 NOTE — ED Triage Notes (Signed)
Pt presents with c/o constant gagging cough, wheezing X 1 week.  States it started with a dry cough.   Home interventions: NyQuil and Ibuprofen

## 2023-06-17 NOTE — Telephone Encounter (Signed)
     Chief Complaint: Cough, wheezing, history of asthma, SOB Symptoms: Above Frequency: July 3 Pertinent Negatives: Patient denies fever Disposition: [] ED /[x] Urgent Care (no appt availability in office) / [] Appointment(In office/virtual)/ []  [place] [facility]/ [] Home Care/ [] Refused Recommended Disposition /[] [place] [facility]/ []  Follow-up with PCP Additional Notes: No availability in the practice.  Reason for Disposition  [1] MILD difficulty breathing (e.g., minimal/no SOB at rest, SOB with walking, pulse <100) AND [2] still present when not coughing  Answer Assessment - Initial Assessment Questions 1. ONSET: "When did the cough begin?"      July 3 2. SEVERITY: "How bad is the cough today?"      Sever, can't sleep 3. SPUTUM: "Describe the color of your sputum" (none, dry cough; clear, white, yellow, green)     Clear 4. HEMOPTYSIS: "Are you coughing up any blood?" If so ask: "How much?" (flecks, streaks, tablespoons, etc.)     No 5. DIFFICULTY BREATHING: "Are you having difficulty breathing?" If Yes, ask: "How bad is it?" (e.g., mild, moderate, severe)    - MILD: No SOB at rest, mild SOB with walking, speaks normally in sentences, can lie down, no retractions, pulse < 100.    - MODERATE: SOB at rest, SOB with minimal exertion and prefers to sit, cannot lie down flat, speaks in phrases, mild retractions, audible wheezing, pulse 100-120.    - SEVERE: Very SOB at rest, speaks in single words, struggling to breathe, sitting hunched forward, retractions, pulse > 120      Mild-moderate 6. FEVER: "Do you have a fever?" If Yes, ask: "What is your temperature, how was it measured, and when did it start?"     No 7. CARDIAC HISTORY: "Do you have any history of heart disease?" (e.g., heart attack, congestive heart failure)      No 8. LUNG HISTORY: "Do you have any history of lung disease?"  (e.g., pulmonary embolus, asthma, emphysema)     Asthma 9. PE RISK FACTORS: "Do you have a  history of blood clots?" (or: recent major surgery, recent prolonged travel, bedridden)     No 10. OTHER SYMPTOMS: "Do you have any other symptoms?" (e.g., runny nose, wheezing, chest pain)       Wheezing, sore throat 11. PREGNANCY: "Is there any chance you are pregnant?" "When was your last menstrual period?"       No 12. TRAVEL: "Have you traveled out of the country in the last month?" (e.g., travel history, exposures)       No  Protocols used: Cough - Acute Productive-A-AH

## 2023-06-17 NOTE — Discharge Instructions (Addendum)
Follow-up with PCP to discuss long-acting inhaler. My system does not indicate coverage for [facility] or Symbicort with current insurance plan. PCP may need to complete a prior authorization.  Take all medication as prescribed. If your symptoms worsen or you experience worsening difficulty breathing, go to the nearest Emergency Department.   Go to one of our outpatient imaging sites to have a chest x-ray completed.

## 2023-06-24 ENCOUNTER — Other Ambulatory Visit (INDEPENDENT_AMBULATORY_CARE_PROVIDER_SITE_OTHER): Payer: Self-pay | Admitting: Primary Care

## 2023-06-24 MED ORDER — LEVALBUTEROL TARTRATE 45 MCG/ACT IN AERO: 2.0000 | INHALATION_SPRAY | Freq: Four times a day (QID) | RESPIRATORY_TRACT | 3 refills | Status: DC | PRN

## 2023-08-30 ENCOUNTER — Telehealth (INDEPENDENT_AMBULATORY_CARE_PROVIDER_SITE_OTHER): Payer: Self-pay

## 2023-08-30 NOTE — Telephone Encounter (Signed)
Contacted pt to schedule a bp check pt didn't answer lvm  If pt calls back please schedule nurse visit for bp check will need to be before the end of the month

## 2023-09-14 ENCOUNTER — Other Ambulatory Visit (INDEPENDENT_AMBULATORY_CARE_PROVIDER_SITE_OTHER): Payer: Self-pay | Admitting: Primary Care

## 2023-09-15 NOTE — Telephone Encounter (Signed)
Requested medication (s) are due for refill today: yes  Requested medication (s) are on the active medication list: yes  Last refill:  03/11/23  Future visit scheduled: no  Notes to clinic:  Unable to refill per protocol due to failed labs, no updated results.      Requested Prescriptions  Pending Prescriptions Disp Refills   ibuprofen (ADVIL) 800 MG tablet [Pharmacy Med Name: Ibuprofen 800 MG Oral Tablet] 90 tablet 0    Sig: TAKE 1 TABLET BY MOUTH EVERY 8 HOURS AS NEEDED     Analgesics:  NSAIDS Failed - 09/14/2023  3:40 PM      Failed - Manual Review: Labs are only required if the patient has taken medication for more than 8 weeks.      Failed - Cr in normal range and within 360 days    Creat  Date Value Ref Range Status  01/31/2014 0.89 0.50 - 1.10 mg/dL Final   Creatinine, Ser  Date Value Ref Range Status  01/07/2022 0.90 0.57 - 1.00 mg/dL Final         Failed - HGB in normal range and within 360 days    Hemoglobin  Date Value Ref Range Status  01/07/2022 13.0 11.1 - 15.9 g/dL Final         Failed - PLT in normal range and within 360 days    Platelets  Date Value Ref Range Status  01/07/2022 226 150 - 450 x10E3/uL Final         Failed - HCT in normal range and within 360 days    Hematocrit  Date Value Ref Range Status  01/07/2022 40.7 34.0 - 46.6 % Final         Failed - eGFR is 30 or above and within 360 days    GFR calc Af Amer  Date Value Ref Range Status  08/04/2019 >60 >60 mL/min Final   GFR calc non Af Amer  Date Value Ref Range Status  08/04/2019 >60 >60 mL/min Final   eGFR  Date Value Ref Range Status  01/07/2022 74 >59 mL/min/1.73 Final         Passed - Patient is not pregnant      Passed - Valid encounter within last 12 months    Recent Outpatient Visits           5 months ago HYPERTENSION, BENIGN   [place] [facility] Grayce Sessions, NP   8 months ago Morbid obesity [facility])   [place] [facility] Grayce Sessions, NP   11 months ago Need for immunization against influenza   [place] [facility] Grayce Sessions, NP   1 year ago Essential hypertension   [place] [facility] Grayce Sessions, NP   1 year ago Productive cough   [place] [facility] Grayce Sessions, NP

## 2023-10-23 ENCOUNTER — Other Ambulatory Visit (INDEPENDENT_AMBULATORY_CARE_PROVIDER_SITE_OTHER): Payer: Self-pay | Admitting: Primary Care

## 2023-10-23 DIAGNOSIS — Z760 Encounter for issue of repeat prescription: Secondary | ICD-10-CM

## 2023-10-23 DIAGNOSIS — I10 Essential (primary) hypertension: Secondary | ICD-10-CM

## 2023-12-17 ENCOUNTER — Other Ambulatory Visit (INDEPENDENT_AMBULATORY_CARE_PROVIDER_SITE_OTHER): Payer: Self-pay | Admitting: Primary Care

## 2023-12-17 DIAGNOSIS — Z760 Encounter for issue of repeat prescription: Secondary | ICD-10-CM

## 2023-12-17 DIAGNOSIS — I10 Essential (primary) hypertension: Secondary | ICD-10-CM

## 2023-12-23 ENCOUNTER — Other Ambulatory Visit (INDEPENDENT_AMBULATORY_CARE_PROVIDER_SITE_OTHER): Payer: Self-pay | Admitting: Primary Care

## 2023-12-23 DIAGNOSIS — I10 Essential (primary) hypertension: Secondary | ICD-10-CM

## 2023-12-23 DIAGNOSIS — Z760 Encounter for issue of repeat prescription: Secondary | ICD-10-CM

## 2023-12-23 NOTE — Telephone Encounter (Signed)
Medication Refill -  Most Recent Primary Care Visit:  Provider: Grayce Sessions  Department: [facility]  Visit Type: OFFICE VISIT  Date: 04/01/2023  Medication: losartan-hydrochlorothiazide (HYZAAR) 100-25 MG tablet [[id number]] ibuprofen (ADVIL) 800 MG tablet [[id number]] amLODipine (NORVASC) 10 MG tablet [[id number]]   Has the patient contacted their pharmacy? Yes  (Agent: If yes, when and what did the pharmacy advise?) Contact Office  Is this the correct pharmacy for this prescription? Yes  This is the patient's preferred pharmacy:  [facility] [address] [place], [place] - [address] [place] [address] Phone: [phone] Fax: [phone]   Has the prescription been filled recently? Yes  Is the patient out of the medication? Yes  Has the patient been seen for an appointment in the last year OR does the patient have an upcoming appointment? Yes  Can we respond through MyChart? Yes  Agent: Please be advised that Rx refills may take up to 3 business days. We ask that you follow-up with your pharmacy.  Pt is requesting a short supply to last until her appointment.

## 2023-12-23 NOTE — Telephone Encounter (Signed)
Requested medication (s) are due for refill today -yes  Requested medication (s) are on the active medication list -yes  Future visit scheduled -yes  Last refill: losartan-hydrochlorothiazide- 04/01/23 #90 1RF- fails lab protocol- over 1 year-01/07/22                  Ibuprofen- 09/17/23 #90- fails lab protocol- over 1 year-01/07/22                  Amlodipine-04/01/23 #90 - fails visit protocol- courtesy RF already given by provider  Notes to clinic: Appointment 01/03/24- request have been refused by office- duplicate request  Requested Prescriptions  Pending Prescriptions Disp Refills   losartan-hydrochlorothiazide (HYZAAR) 100-25 MG tablet 90 tablet 1    Sig: Take 1 tablet by mouth daily.     Cardiovascular: ARB + Diuretic Combos Failed - 12/23/2023  3:47 PM      Failed - K in normal range and within 180 days    Potassium  Date Value Ref Range Status  01/07/2022 4.0 3.5 - 5.2 mmol/L Final         Failed - Na in normal range and within 180 days    Sodium  Date Value Ref Range Status  01/07/2022 142 134 - 144 mmol/L Final         Failed - Cr in normal range and within 180 days    Creat  Date Value Ref Range Status  01/31/2014 0.89 0.50 - 1.10 mg/dL Final   Creatinine, Ser  Date Value Ref Range Status  01/07/2022 0.90 0.57 - 1.00 mg/dL Final         Failed - eGFR is 10 or above and within 180 days    GFR calc Af Amer  Date Value Ref Range Status  08/04/2019 >60 >60 mL/min Final   GFR calc non Af Amer  Date Value Ref Range Status  08/04/2019 >60 >60 mL/min Final   eGFR  Date Value Ref Range Status  01/07/2022 74 >59 mL/min/1.73 Final         Failed - Last BP in normal range    BP Readings from Last 1 Encounters:  06/17/23 (!) 145/80         Failed - Valid encounter within last 6 months    Recent Outpatient Visits           8 months ago HYPERTENSION, BENIGN   [place] [facility] Grayce Sessions, NP   11 months ago Morbid obesity (HCC)    [place] [facility] Grayce Sessions, NP   1 year ago Need for immunization against influenza   [place] [facility] Grayce Sessions, NP   1 year ago Essential hypertension   [place] [facility] Grayce Sessions, NP   1 year ago Productive cough   [place] [facility] Grayce Sessions, NP       Future Appointments             In 1 week Grayce Sessions, NP  [facility]            Passed - Patient is not pregnant       ibuprofen (ADVIL) 800 MG tablet 90 tablet 0    Sig: Take 1 tablet (800 mg total) by mouth every 8 (eight) hours as needed.     Analgesics:  NSAIDS Failed - 12/23/2023  3:47 PM      Failed - Manual  Review: Labs are only required if the patient has taken medication for more than 8 weeks.      Failed - Cr in normal range and within 360 days    Creat  Date Value Ref Range Status  01/31/2014 0.89 0.50 - 1.10 mg/dL Final   Creatinine, Ser  Date Value Ref Range Status  01/07/2022 0.90 0.57 - 1.00 mg/dL Final         Failed - HGB in normal range and within 360 days    Hemoglobin  Date Value Ref Range Status  01/07/2022 13.0 11.1 - 15.9 g/dL Final         Failed - PLT in normal range and within 360 days    Platelets  Date Value Ref Range Status  01/07/2022 226 150 - 450 x10E3/uL Final         Failed - HCT in normal range and within 360 days    Hematocrit  Date Value Ref Range Status  01/07/2022 40.7 34.0 - 46.6 % Final         Failed - eGFR is 30 or above and within 360 days    GFR calc Af Amer  Date Value Ref Range Status  08/04/2019 >60 >60 mL/min Final   GFR calc non Af Amer  Date Value Ref Range Status  08/04/2019 >60 >60 mL/min Final   eGFR  Date Value Ref Range Status  01/07/2022 74 >59 mL/min/1.73 Final         Passed - Patient is not pregnant      Passed - Valid encounter within last 12 months    Recent  Outpatient Visits           8 months ago HYPERTENSION, BENIGN   [place] [facility] Grayce Sessions, NP   11 months ago Morbid obesity (HCC)   [place] [facility] Grayce Sessions, NP   1 year ago Need for immunization against influenza   [place] [facility] Grayce Sessions, NP   1 year ago Essential hypertension   [place] [facility] Grayce Sessions, NP   1 year ago Productive cough   [place] [facility] Grayce Sessions, NP       Future Appointments             In 1 week Grayce Sessions, NP [place] [facility]             amLODipine (NORVASC) 10 MG tablet 90 tablet 1    Sig: Take 1 tablet (10 mg total) by mouth daily.     Cardiovascular: Calcium Channel Blockers 2 Failed - 12/23/2023  3:47 PM      Failed - Last BP in normal range    BP Readings from Last 1 Encounters:  06/17/23 (!) 145/80         Failed - Valid encounter within last 6 months    Recent Outpatient Visits           8 months ago HYPERTENSION, BENIGN   [place] [facility] Grayce Sessions, NP   11 months ago Morbid obesity [facility])   [place] [facility] Grayce Sessions, NP   1 year ago Need for immunization against influenza   [place] [facility] Grayce Sessions, NP   1 year ago Essential hypertension   [place] [facility] Grayce Sessions, NP   1 year ago Productive  cough   [place] [facility] Grayce Sessions, NP       Future Appointments             In 1 week Randa Evens Kinnie Scales, NP [place] [facility]            Passed - Last Heart Rate in normal range    Pulse Readings from Last 1 Encounters:  06/17/23 74            Requested Prescriptions  Pending Prescriptions Disp Refills   losartan-hydrochlorothiazide  (HYZAAR) 100-25 MG tablet 90 tablet 1    Sig: Take 1 tablet by mouth daily.     Cardiovascular: ARB + Diuretic Combos Failed - 12/23/2023  3:47 PM      Failed - K in normal range and within 180 days    Potassium  Date Value Ref Range Status  01/07/2022 4.0 3.5 - 5.2 mmol/L Final         Failed - Na in normal range and within 180 days    Sodium  Date Value Ref Range Status  01/07/2022 142 134 - 144 mmol/L Final         Failed - Cr in normal range and within 180 days    Creat  Date Value Ref Range Status  01/31/2014 0.89 0.50 - 1.10 mg/dL Final   Creatinine, Ser  Date Value Ref Range Status  01/07/2022 0.90 0.57 - 1.00 mg/dL Final         Failed - eGFR is 10 or above and within 180 days    GFR calc Af Amer  Date Value Ref Range Status  08/04/2019 >60 >60 mL/min Final   GFR calc non Af Amer  Date Value Ref Range Status  08/04/2019 >60 >60 mL/min Final   eGFR  Date Value Ref Range Status  01/07/2022 74 >59 mL/min/1.73 Final         Failed - Last BP in normal range    BP Readings from Last 1 Encounters:  06/17/23 (!) 145/80         Failed - Valid encounter within last 6 months    Recent Outpatient Visits           8 months ago HYPERTENSION, BENIGN   [place] [facility] Grayce Sessions, NP   11 months ago Morbid obesity (HCC)   [place] [facility] Grayce Sessions, NP   1 year ago Need for immunization against influenza   [place] [facility] Grayce Sessions, NP   1 year ago Essential hypertension   [place] [facility] Grayce Sessions, NP   1 year ago Productive cough   [place] [facility] Grayce Sessions, NP       Future Appointments             In 1 week Grayce Sessions, NP [place] [facility]            Passed - Patient is not pregnant       ibuprofen (ADVIL) 800 MG tablet 90 tablet 0    Sig: Take 1 tablet  (800 mg total) by mouth every 8 (eight) hours as needed.     Analgesics:  NSAIDS Failed - 12/23/2023  3:47 PM      Failed - Manual Review: Labs are only required if the patient has taken medication for more than 8 weeks.  Failed - Cr in normal range and within 360 days    Creat  Date Value Ref Range Status  01/31/2014 0.89 0.50 - 1.10 mg/dL Final   Creatinine, Ser  Date Value Ref Range Status  01/07/2022 0.90 0.57 - 1.00 mg/dL Final         Failed - HGB in normal range and within 360 days    Hemoglobin  Date Value Ref Range Status  01/07/2022 13.0 11.1 - 15.9 g/dL Final         Failed - PLT in normal range and within 360 days    Platelets  Date Value Ref Range Status  01/07/2022 226 150 - 450 x10E3/uL Final         Failed - HCT in normal range and within 360 days    Hematocrit  Date Value Ref Range Status  01/07/2022 40.7 34.0 - 46.6 % Final         Failed - eGFR is 30 or above and within 360 days    GFR calc Af Amer  Date Value Ref Range Status  08/04/2019 >60 >60 mL/min Final   GFR calc non Af Amer  Date Value Ref Range Status  08/04/2019 >60 >60 mL/min Final   eGFR  Date Value Ref Range Status  01/07/2022 74 >59 mL/min/1.73 Final         Passed - Patient is not pregnant      Passed - Valid encounter within last 12 months    Recent Outpatient Visits           8 months ago HYPERTENSION, BENIGN   [place] [facility] Grayce Sessions, NP   11 months ago Morbid obesity (HCC)   [place] [facility] Grayce Sessions, NP   1 year ago Need for immunization against influenza   [place] [facility] Grayce Sessions, NP   1 year ago Essential hypertension   [place] [facility] Grayce Sessions, NP   1 year ago Productive cough   [place] [facility] Grayce Sessions, NP       Future Appointments             In 1 week Grayce Sessions, NP  [place] [facility]             amLODipine (NORVASC) 10 MG tablet 90 tablet 1    Sig: Take 1 tablet (10 mg total) by mouth daily.     Cardiovascular: Calcium Channel Blockers 2 Failed - 12/23/2023  3:47 PM      Failed - Last BP in normal range    BP Readings from Last 1 Encounters:  06/17/23 (!) 145/80         Failed - Valid encounter within last 6 months    Recent Outpatient Visits           8 months ago HYPERTENSION, BENIGN   [place] [facility] Grayce Sessions, NP   11 months ago Morbid obesity [facility])   [place] [facility] Grayce Sessions, NP   1 year ago Need for immunization against influenza   [place] [facility] Grayce Sessions, NP   1 year ago Essential hypertension   [place] [facility] Grayce Sessions, NP   1 year ago Productive cough    [facility] Grayce Sessions, NP       Future Appointments  In 1 week Grayce Sessions, NP [place] [facility]            Passed - Last Heart Rate in normal range    Pulse Readings from Last 1 Encounters:  06/17/23 74

## 2024-01-03 ENCOUNTER — Ambulatory Visit (INDEPENDENT_AMBULATORY_CARE_PROVIDER_SITE_OTHER): Payer: Managed Care, Other (non HMO) | Admitting: Primary Care

## 2024-01-03 ENCOUNTER — Encounter (INDEPENDENT_AMBULATORY_CARE_PROVIDER_SITE_OTHER): Payer: Self-pay | Admitting: Primary Care

## 2024-01-03 VITALS — BP 150/98 | HR 64 | Resp 16 | Ht 62.0 in | Wt 337.8 lb

## 2024-01-03 DIAGNOSIS — M545 Low back pain, unspecified: Secondary | ICD-10-CM

## 2024-01-03 DIAGNOSIS — M25561 Pain in right knee: Secondary | ICD-10-CM

## 2024-01-03 DIAGNOSIS — I10 Essential (primary) hypertension: Secondary | ICD-10-CM | POA: Diagnosis not present

## 2024-01-03 DIAGNOSIS — E782 Mixed hyperlipidemia: Secondary | ICD-10-CM | POA: Diagnosis not present

## 2024-01-03 DIAGNOSIS — G8929 Other chronic pain: Secondary | ICD-10-CM

## 2024-01-03 DIAGNOSIS — Z760 Encounter for issue of repeat prescription: Secondary | ICD-10-CM

## 2024-01-03 MED ORDER — METOPROLOL TARTRATE 25 MG PO TABS: 25.0000 mg | ORAL_TABLET | Freq: Two times a day (BID) | ORAL | 0 refills | Status: DC

## 2024-01-03 MED ORDER — LOSARTAN POTASSIUM-HCTZ 100-25 MG PO TABS: 1.0000 | ORAL_TABLET | Freq: Every day | ORAL | 1 refills | Status: DC

## 2024-01-03 MED ORDER — IBUPROFEN 800 MG PO TABS: 800.0000 mg | ORAL_TABLET | Freq: Three times a day (TID) | ORAL | 0 refills | Status: DC | PRN

## 2024-01-03 MED ORDER — CLONIDINE HCL 0.2 MG PO TABS: 0.2000 mg | ORAL_TABLET | Freq: Every day | ORAL | 1 refills | Status: DC

## 2024-01-03 MED ORDER — AMLODIPINE BESYLATE 10 MG PO TABS: 10.0000 mg | ORAL_TABLET | Freq: Every day | ORAL | 1 refills | Status: DC

## 2024-01-03 NOTE — Progress Notes (Signed)
[facility]  [name], is a 63 y.o. female  GGY:[id number]  OJJ:[id number]  DOB - [birth year]  Chief Complaint  Patient presents with   Hypertension    Have been out of some bp medications    Back Pain    Lumbar pain         Subjective:   [name] is a 63 y.o. female here today for a follow up visit. Patient has No headache, No chest pain, No abdominal pain - No Nausea, No new weakness tingling or numbness, No Cough - shortness of breath Hypertension This is a chronic problem. The current episode started more than 1 year ago. The problem has been gradually worsening (out of medication missed appts) since onset. Associated symptoms include headaches and shortness of breath. Agents associated with hypertension include NSAIDs. Risk factors for coronary artery disease include smoking/tobacco exposure, obesity and dyslipidemia. Past treatments include beta blockers, angiotensin blockers and calcium channel blockers. There are no compliance problems.   Back Pain This is a chronic problem. The current episode started more than 1 year ago. The problem occurs constantly. The problem has been gradually worsening since onset. The pain is present in the lumbar spine. The quality of the pain is described as aching. The pain does not radiate. The pain is at a severity of 9/10. The pain is moderate. The pain is The same all the time. The symptoms are aggravated by standing and position. Stiffness is present All day. Associated symptoms include headaches. Risk factors include obesity and poor posture. She has tried NSAIDs for the symptoms. The treatment provided mild relief.    No problems updated.  Comprehensive ROS Pertinent positive and negative noted in HPI   No Known Allergies  Past Medical History:  Diagnosis Date   Asthma    Hypertension    Left leg DVT (HCC) 12/07/2004   s/p C-section, 1 month hospitaliation for staph infection and period of immobility   Obesity     Staphylococcal infection of skin 12/07/2004   1 month hospitalization, s/p C section    Current Outpatient Medications on File Prior to Visit  Medication Sig Dispense Refill   albuterol (PROVENTIL) (2.5 MG/3ML) 0.083% nebulizer solution Take 3 mLs (2.5 mg total) by nebulization every 6 (six) hours as needed for wheezing or shortness of breath. 150 mL 1   atorvastatin (LIPITOR) 10 MG tablet Take 1 tablet by mouth once daily 90 tablet 0   Blood Pressure Monitoring KIT 1 Bag by Does not apply route 3 (three) times daily. 1 kit 0   Budesonide (PULMICORT FLEXHALER) 90 MCG/ACT inhaler Inhale 2 puffs into the lungs 2 (two) times daily. 1 each 3   No current facility-administered medications on file prior to visit.   Health Maintenance  Topic Date Due   Pneumococcal Vaccination (2 of 2 - PCV) 01/31/2015   Pap with HPV screening  03/06/2023   Flu Shot  07/08/2023   COVID-19 Vaccine (3 - 2024-25 season) 08/08/2023   DTaP/Tdap/Td vaccine (3 - Td or Tdap) 02/01/2024   Mammogram  02/25/2025   Colon Cancer Screening  02/15/2033   Hepatitis C Screening  Completed   HIV Screening  Completed   Zoster (Shingles) Vaccine  Completed   HPV Vaccine  Aged Out    Objective:   Vitals:   01/03/24 1532 01/03/24 1533  BP: (!) 163/76 (!) 177/81  Pulse: 64   Resp: 16   SpO2: 95%   Weight: (!) 337 lb 12.8 oz (153.2  kg)   Height: 5\' 2"  (1.575 m)    BP Readings from Last 3 Encounters:  01/03/24 (!) 177/81  06/17/23 (!) 145/80  04/01/23 (!) 148/84      Physical Exam Vitals reviewed.  Constitutional:      Appearance: Normal appearance. She is obese.  HENT:     Head: Normocephalic.     Right Ear: Tympanic membrane, ear canal and external ear normal.     Left Ear: Tympanic membrane, ear canal and external ear normal.     Nose: Nose normal.     Mouth/Throat:     Mouth: Mucous membranes are dry.  Eyes:     Extraocular Movements: Extraocular movements intact.     Pupils: Pupils are equal, round,  and reactive to light.  Cardiovascular:     Rate and Rhythm: Normal rate and regular rhythm.  Pulmonary:     Effort: Pulmonary effort is normal.     Breath sounds: Wheezing present.  Abdominal:     General: Bowel sounds are normal.     Palpations: Abdomen is soft.  Musculoskeletal:        General: Normal range of motion.     Cervical back: Normal range of motion.  Skin:    General: Skin is warm and dry.  Neurological:     Mental Status: She is alert and oriented to person, place, and time.  Psychiatric:        Mood and Affect: Mood normal.        Behavior: Behavior normal.        Thought Content: Thought content normal.       Assessment & Plan  Adalaide was seen today for hypertension and back pain.  Diagnoses and all orders for this visit:  Mixed hyperlipidemia -     Lipid panel  Essential hypertension Out of Bp meds missed appts Explained that having normal blood pressure is the goal and medications are helping to get to goal and maintain normal blood pressure. DIET: Limit salt intake, read nutrition labels to check salt content, limit fried and high fatty foods  Avoid using multisymptom OTC cold preparations that generally contain sudafed which can rise BP. Consult with pharmacist on best cold relief products to use for persons with HTN EXERCISE Discussed incorporating exercise such as walking - 30 minutes most days of the week and can do in 10 minute intervals    -     CBC with Differential/Platelet -     CMP14+EGFR -     amLODipine (NORVASC) 10 MG tablet; Take 1 tablet (10 mg total) by mouth daily. -     cloNIDine (CATAPRES) 0.2 MG tablet; Take 1 tablet (0.2 mg total) by mouth daily. -     losartan-hydrochlorothiazide (HYZAAR) 100-25 MG tablet; Take 1 tablet by mouth daily. -     metoprolol tartrate (LOPRESSOR) 25 MG tablet; Take 1 tablet (25 mg total) by mouth 2 (two) times daily.  Medication refill -     amLODipine (NORVASC) 10 MG tablet; Take 1 tablet (10 mg  total) by mouth daily. -     cloNIDine (CATAPRES) 0.2 MG tablet; Take 1 tablet (0.2 mg total) by mouth daily. -     losartan-hydrochlorothiazide (HYZAAR) 100-25 MG tablet; Take 1 tablet by mouth daily. -     metoprolol tartrate (LOPRESSOR) 25 MG tablet; Take 1 tablet (25 mg total) by mouth 2 (two) times daily.  Chronic pain of right knee Ambulatory referral to Orthopedic Surgery   Chronic bilateral  low back pain without sciatica -     Ambulatory referral to Neurosurgery  Other orders -     ibuprofen (ADVIL) 800 MG tablet; Take 1 tablet (800 mg total) by mouth every 8 (eight) hours as needed.      Patient have been counseled extensively about nutrition and exercise. Other issues discussed during this visit include: low cholesterol diet, weight control and daily exercise, foot care, annual eye examinations at Ophthalmology, importance of adherence with medications and regular follow-up. We also discussed long term complications of uncontrolled diabetes and hypertension.   Return in about 3 months (around 04/02/2024).  The patient was given clear instructions to go to ER or return to medical center if symptoms don't improve, worsen or new problems develop. The patient verbalized understanding. The patient was told to call to get lab results if they haven't heard anything in the next week.   This note has been created with Education officer, environmental. Any transcriptional errors are unintentional.   Grayce Sessions, NP 01/03/2024, 4:01 PM

## 2024-01-04 ENCOUNTER — Other Ambulatory Visit (INDEPENDENT_AMBULATORY_CARE_PROVIDER_SITE_OTHER): Payer: Managed Care, Other (non HMO)

## 2024-01-04 DIAGNOSIS — I10 Essential (primary) hypertension: Secondary | ICD-10-CM

## 2024-01-04 DIAGNOSIS — E782 Mixed hyperlipidemia: Secondary | ICD-10-CM

## 2024-01-05 LAB — CBC WITH DIFFERENTIAL/PLATELET
Basophils Absolute: 0 10*3/uL (ref 0.0–0.2)
Basos: 0 %
EOS (ABSOLUTE): 0.1 10*3/uL (ref 0.0–0.4)
Eos: 1 %
Hematocrit: 45.9 % (ref 34.0–46.6)
Hemoglobin: 14.5 g/dL (ref 11.1–15.9)
Immature Grans (Abs): 0 10*3/uL (ref 0.0–0.1)
Immature Granulocytes: 0 %
Lymphocytes Absolute: 3.5 10*3/uL — ABNORMAL HIGH (ref 0.7–3.1)
Lymphs: 35 %
MCH: 25.5 pg — ABNORMAL LOW (ref 26.6–33.0)
MCHC: 31.6 g/dL (ref 31.5–35.7)
MCV: 81 fL (ref 79–97)
Monocytes Absolute: 0.9 10*3/uL (ref 0.1–0.9)
Monocytes: 9 %
Neutrophils Absolute: 5.4 10*3/uL (ref 1.4–7.0)
Neutrophils: 55 %
Platelets: 180 10*3/uL (ref 150–450)
RBC: 5.69 x10E6/uL — ABNORMAL HIGH (ref 3.77–5.28)
RDW: 15.4 % (ref 11.7–15.4)
WBC: 9.9 10*3/uL (ref 3.4–10.8)

## 2024-01-05 LAB — CMP14+EGFR
ALT: 14 [IU]/L (ref 0–32)
AST: 15 [IU]/L (ref 0–40)
Albumin: 4.1 g/dL (ref 3.8–4.9)
Alkaline Phosphatase: 98 [IU]/L (ref 44–121)
BUN/Creatinine Ratio: 8 — ABNORMAL LOW (ref 12–28)
BUN: 8 mg/dL (ref 8–27)
Bilirubin Total: 0.5 mg/dL (ref 0.0–1.2)
CO2: 24 mmol/L (ref 20–29)
Calcium: 9.8 mg/dL (ref 8.7–10.3)
Chloride: 100 mmol/L (ref 96–106)
Creatinine, Ser: 0.98 mg/dL (ref 0.57–1.00)
Globulin, Total: 3.1 g/dL (ref 1.5–4.5)
Glucose: 83 mg/dL (ref 70–99)
Potassium: 4.6 mmol/L (ref 3.5–5.2)
Sodium: 141 mmol/L (ref 134–144)
Total Protein: 7.2 g/dL (ref 6.0–8.5)
eGFR: 66 mL/min/{1.73_m2} (ref >59)

## 2024-01-05 LAB — LIPID PANEL
Chol/HDL Ratio: 2.7 {ratio} (ref 0.0–4.4)
Cholesterol, Total: 141 mg/dL (ref 100–199)
HDL: 53 mg/dL (ref >39)
LDL Chol Calc (NIH): 65 mg/dL (ref 0–99)
Triglycerides: 131 mg/dL (ref 0–149)
VLDL Cholesterol Cal: 23 mg/dL (ref 5–40)

## 2024-01-10 ENCOUNTER — Other Ambulatory Visit (INDEPENDENT_AMBULATORY_CARE_PROVIDER_SITE_OTHER): Payer: Self-pay | Admitting: Primary Care

## 2024-01-10 MED ORDER — ATORVASTATIN CALCIUM 10 MG PO TABS: 10.0000 mg | ORAL_TABLET | Freq: Every day | ORAL | 1 refills | Status: DC

## 2024-01-13 ENCOUNTER — Encounter: Payer: Self-pay | Admitting: Physician Assistant

## 2024-01-13 ENCOUNTER — Ambulatory Visit (INDEPENDENT_AMBULATORY_CARE_PROVIDER_SITE_OTHER): Payer: Managed Care, Other (non HMO) | Admitting: Physician Assistant

## 2024-01-13 ENCOUNTER — Other Ambulatory Visit (INDEPENDENT_AMBULATORY_CARE_PROVIDER_SITE_OTHER): Payer: Managed Care, Other (non HMO)

## 2024-01-13 DIAGNOSIS — M25562 Pain in left knee: Secondary | ICD-10-CM

## 2024-01-13 DIAGNOSIS — G8929 Other chronic pain: Secondary | ICD-10-CM

## 2024-01-13 DIAGNOSIS — M25561 Pain in right knee: Secondary | ICD-10-CM

## 2024-01-13 MED ORDER — METHYLPREDNISOLONE ACETATE 40 MG/ML IJ SUSP
40.0000 mg | INTRAMUSCULAR | Status: AC | PRN
  Administered 2024-01-13: 40 mg via INTRA_ARTICULAR

## 2024-01-13 MED ORDER — LIDOCAINE HCL 1 % IJ SOLN
3.0000 mL | INTRAMUSCULAR | Status: AC | PRN
  Administered 2024-01-13: 3 mL

## 2024-01-13 NOTE — Progress Notes (Signed)
 Office Visit Note   Patient: [name]           Date of Birth: [birth year]           MRN: [id number] Visit Date: 01/13/2024              Requested by: Celestia Rosaline SQUIBB, NP [address] [place],  [place] [address] PCP: Celestia Rosaline SQUIBB, NP   Assessment & Plan: Visit Diagnoses:  1. Left knee pain, unspecified chronicity     Plan: Patient is a pleasant 63 year old woman with left greater than right knee pain.  She does have a history of arthritis in the left knee has had intra-articular cortisone injection and did well.  Denies any injury or fever chills x-rays and exam findings consistent with bilateral osteoarthritis discussed options.  She like to go forward with injections and will consider viscosupplementation if this does not work  Follow-Up Instructions: No follow-ups on file.   Orders:  No orders of the defined types were placed in this encounter.  No orders of the defined types were placed in this encounter.     Procedures: Large Joint Inj: bilateral knee on 01/13/2024 9:27 AM Indications: pain and diagnostic evaluation Details: 25 G 1.5 in needle, anteromedial approach  Arthrogram: No  Medications (Right): 3 mL lidocaine  1 %; 40 mg methylPREDNISolone  acetate 40 MG/ML Medications (Left): 3 mL lidocaine  1 %; 40 mg methylPREDNISolone  acetate 40 MG/ML Outcome: tolerated well, no immediate complications Procedure, treatment alternatives, risks and benefits explained, specific risks discussed. Consent was given by the patient.       Clinical Data: No additional findings.   Subjective: Chief Complaint  Patient presents with   Right Knee - Pain    HPI patient is a pleasant 63 year old woman with a chief complaint of bilateral knee pain.  She says she has had left knee pain for a while.  She has had an injection into the left knee which did help her quite a bit.  She is now been having more problems with the right knee.  Denies any injuries.   Denies any fever or chills.  Has taken ibuprofen  and with the EXTR of her other injection has not really done much else new  Review of Systems  All other systems reviewed and are negative.    Objective: Vital Signs: LMP 07/19/2019 (Approximate)   Physical Exam Constitutional:      Appearance: Normal appearance.  Pulmonary:     Effort: Pulmonary effort is normal.  Skin:    General: Skin is warm and dry.  Neurological:     General: No focal deficit present.     Mental Status: She is alert and oriented to person, place, and time.  Psychiatric:        Mood and Affect: Mood normal.        Behavior: Behavior normal.     Ortho Exam Bilateral knees no effusion no erythema she does have crepitus with range of motion on the left greater than the right she is neurovascularly intact compartments are soft and nontender  Specialty Comments:  No specialty comments available.  Imaging: No results found.   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   BUNIONS, BILATERAL 05/27/2007   LEG PAIN, RIGHT 05/27/2007   PES PLANUS 05/27/2007   HYPERTENSION, BENIGN 02/18/2007   BAKERS CYST 02/03/2007   Past Medical History:  Diagnosis Date   Asthma    Hypertension    Left leg DVT (HCC) 12/07/2004   s/p C-section, 1 month hospitaliation for staph infection and period of immobility   Obesity    Staphylococcal infection of skin 12/07/2004   1 month hospitalization, s/p C section    Family History  Problem Relation Age of Onset   Hypertension Mother    Diabetes Mother    Kidney disease Father        transplant, and subsequent failure     Pneumonia Father        died of pneumonia   Hypertension Father    Cancer Brother        stomach   Alcohol abuse Brother    Ulcers Brother        bleeding ulcer related to alcohol abuse   Heart disease Neg Hx    Stroke Neg Hx    Colon cancer Neg Hx     Past Surgical History:  Procedure Laterality Date   ABDOMINAL EXPLORATION SURGERY  2006   staph infection s/p C-section   BIOPSY  02/16/2023   Procedure: BIOPSY;  Surgeon: Dianna Specking, MD;  Location: [facility];  Service: Gastroenterology;;   BREAST BIOPSY Left 03/11/2023   MM LT BREAST BX W LOC DEV 1ST LESION IMAGE BX SPEC STEREO GUIDE 03/11/2023 [facility]   CESAREAN SECTION     staph infection s/p childbirth by C-section, then Exploratory abdominal surgery done.   COLONOSCOPY WITH PROPOFOL  N/A 02/16/2023   Procedure: COLONOSCOPY WITH PROPOFOL ;  Surgeon: Dianna Specking, MD;  Location: [facility];  Service: Gastroenterology;  Laterality: N/A;   POLYPECTOMY  02/16/2023   Procedure: POLYPECTOMY;  Surgeon: Dianna Specking, MD;  Location: [facility];  Service: Gastroenterology;;   TUBAL LIGATION  2006   Social History   Occupational History   Not on file  Tobacco Use   Smoking status: Every Day    Current packs/day: 0.50    Average packs/day: 0.5 packs/day for 20.0 years (10.0 ttl pk-yrs)    Types: Cigarettes   Smokeless tobacco: Never  Vaping Use   Vaping status: Never Used  Substance and Sexual Activity   Alcohol use: No   Drug use: No   Sexual activity: Yes

## 2024-01-17 ENCOUNTER — Other Ambulatory Visit: Payer: Self-pay | Admitting: Neurosurgery

## 2024-01-17 DIAGNOSIS — M48061 Spinal stenosis, lumbar region without neurogenic claudication: Secondary | ICD-10-CM

## 2024-01-26 ENCOUNTER — Inpatient Hospital Stay: Admission: RE | Admit: 2024-01-26 | Payer: Managed Care, Other (non HMO) | Source: Ambulatory Visit

## 2024-01-27 ENCOUNTER — Other Ambulatory Visit (INDEPENDENT_AMBULATORY_CARE_PROVIDER_SITE_OTHER): Payer: Self-pay | Admitting: Primary Care

## 2024-01-27 DIAGNOSIS — Z760 Encounter for issue of repeat prescription: Secondary | ICD-10-CM

## 2024-01-27 DIAGNOSIS — I10 Essential (primary) hypertension: Secondary | ICD-10-CM

## 2024-01-31 ENCOUNTER — Ambulatory Visit
Admission: RE | Admit: 2024-01-31 | Discharge: 2024-01-31 | Disposition: A | Discharge status: Home / Self Care | Payer: Managed Care, Other (non HMO) | Source: Ambulatory Visit | Attending: Neurosurgery | Admitting: Neurosurgery

## 2024-01-31 DIAGNOSIS — M48061 Spinal stenosis, lumbar region without neurogenic claudication: Secondary | ICD-10-CM

## 2024-02-14 ENCOUNTER — Encounter (INDEPENDENT_AMBULATORY_CARE_PROVIDER_SITE_OTHER): Payer: Self-pay | Admitting: Primary Care

## 2024-02-14 ENCOUNTER — Ambulatory Visit (INDEPENDENT_AMBULATORY_CARE_PROVIDER_SITE_OTHER): Payer: Managed Care, Other (non HMO) | Admitting: Primary Care

## 2024-02-14 VITALS — BP 133/83 | HR 63 | Temp 98.6°F | Ht 62.0 in | Wt 332.6 lb

## 2024-02-14 DIAGNOSIS — G8929 Other chronic pain: Secondary | ICD-10-CM | POA: Diagnosis not present

## 2024-02-14 DIAGNOSIS — I10 Essential (primary) hypertension: Secondary | ICD-10-CM | POA: Diagnosis not present

## 2024-02-14 DIAGNOSIS — Z23 Encounter for immunization: Secondary | ICD-10-CM

## 2024-02-14 DIAGNOSIS — Z0130 Encounter for examination of blood pressure without abnormal findings: Secondary | ICD-10-CM

## 2024-02-14 DIAGNOSIS — M25561 Pain in right knee: Secondary | ICD-10-CM

## 2024-02-14 DIAGNOSIS — M25562 Pain in left knee: Secondary | ICD-10-CM | POA: Diagnosis not present

## 2024-02-14 NOTE — Progress Notes (Signed)
 Patient states she is not here for a Pap. stated she was here for a blood pressure re check.   No other things to discuss.

## 2024-02-14 NOTE — Progress Notes (Signed)
 [facility]   [name] is a 63 y.o. female presents for hypertension evaluation, Denies shortness of breath, headaches, chest pain or lower extremity edema, sudden onset, vision changes, unilateral weakness, dizziness, paresthesias   Patient reports adherence with medications.  Dietary habits include: monitoring sodium  Exercise habits include:no Family / Social history: HTN, Diabetes,    Past Medical History:  Diagnosis Date   Asthma    Hypertension    Left leg DVT (HCC) 12/07/2004   s/p C-section, 1 month hospitaliation for staph infection and period of immobility   Obesity    Staphylococcal infection of skin 12/07/2004   1 month hospitalization, s/p C section   Past Surgical History:  Procedure Laterality Date   ABDOMINAL EXPLORATION SURGERY  2006   staph infection s/p C-section   BIOPSY  02/16/2023   Procedure: BIOPSY;  Surgeon: Charlott Rakes, MD;  Location: [facility];  Service: Gastroenterology;;   BREAST BIOPSY Left 03/11/2023   MM LT BREAST BX W LOC DEV 1ST LESION IMAGE BX SPEC STEREO GUIDE 03/11/2023 [facility]   CESAREAN SECTION     staph infection s/p childbirth by C-section, then Exploratory abdominal surgery done.   COLONOSCOPY WITH PROPOFOL N/A 02/16/2023   Procedure: COLONOSCOPY WITH PROPOFOL;  Surgeon: Charlott Rakes, MD;  Location: [facility];  Service: Gastroenterology;  Laterality: N/A;   POLYPECTOMY  02/16/2023   Procedure: POLYPECTOMY;  Surgeon: Charlott Rakes, MD;  Location: [facility];  Service: Gastroenterology;;   TUBAL LIGATION  2006   No Known Allergies Current Outpatient Medications on File Prior to Visit  Medication Sig Dispense Refill   albuterol (PROVENTIL) (2.5 MG/3ML) 0.083% nebulizer solution Take 3 mLs (2.5 mg total) by nebulization every 6 (six) hours as needed for wheezing or shortness of breath. 150 mL 1   amLODipine (NORVASC) 10 MG tablet Take 1 tablet (10 mg total) by mouth daily. 90 each 1    atorvastatin (LIPITOR) 10 MG tablet Take 1 tablet (10 mg total) by mouth daily. 90 tablet 1   Blood Pressure Monitoring KIT 1 Bag by Does not apply route 3 (three) times daily. 1 kit 0   Budesonide (PULMICORT FLEXHALER) 90 MCG/ACT inhaler Inhale 2 puffs into the lungs 2 (two) times daily. 1 each 3   cloNIDine (CATAPRES) 0.2 MG tablet Take 1 tablet (0.2 mg total) by mouth daily. 90 tablet 1   ibuprofen (ADVIL) 800 MG tablet Take 1 tablet (800 mg total) by mouth every 8 (eight) hours as needed. 90 tablet 0   losartan-hydrochlorothiazide (HYZAAR) 100-25 MG tablet Take 1 tablet by mouth daily. 90 tablet 1   metoprolol tartrate (LOPRESSOR) 25 MG tablet Take 1 tablet by mouth twice daily 180 tablet 0   No current facility-administered medications on file prior to visit.   Social History   Socioeconomic History   Marital status: Married    Spouse name: Not on file   Number of children: Not on file   Years of education: Not on file   Highest education level: Not on file  Occupational History   Not on file  Tobacco Use   Smoking status: Every Day    Current packs/day: 0.50    Average packs/day: 0.5 packs/day for 20.0 years (10.0 ttl pk-yrs)    Types: Cigarettes   Smokeless tobacco: Never  Vaping Use   Vaping status: Never Used  Substance and Sexual Activity   Alcohol use: No   Drug use: No   Sexual activity: Yes  Other Topics Concern  Not on file  Social History Narrative   Married, has 3 children, works for [employer], [place], no exercise   Social Drivers of Health   Financial Resource Strain: Low Risk  (02/14/2024)   Overall Financial Resource Strain (CARDIA)    Difficulty of Paying Living Expenses: Not hard at all  Food Insecurity: Not on File (03/26/2022)   Received from [place], [place]   Food Insecurity    Food: 0  Transportation Needs: Not on File (03/26/2022)   Received from [place], [employer] Needs    Transportation: 0  Physical Activity:  Inactive (02/14/2024)   Exercise Vital Sign    Days of Exercise per Week: 0 days    Minutes of Exercise per Session: 0 min  Stress: No Stress Concern Present (02/14/2024)   [employer] of Occupational Health - Occupational Stress Questionnaire    Feeling of Stress : Not at all  Social Connections: Moderately Integrated (02/14/2024)   Social Connection and Isolation Panel [NHANES]    Frequency of Communication with Friends and Family: More than three times a week    Frequency of Social Gatherings with Friends and Family: Twice a week    Attends Religious Services: More than 4 times per year    Active Member of [employer] or Organizations: No    Attends Banker Meetings: Never    Marital Status: Married  Catering manager Violence: Not At Risk (02/14/2024)   Humiliation, Afraid, Rape, and Kick questionnaire    Fear of Current or Ex-Partner: No    Emotionally Abused: No    Physically Abused: No    Sexually Abused: No   Family History  Problem Relation Age of Onset   Hypertension Mother    Diabetes Mother    Kidney disease Father        transplant, and subsequent failure    Pneumonia Father        died of pneumonia   Hypertension Father    Cancer Brother        stomach   Alcohol abuse Brother    Ulcers Brother        bleeding ulcer related to alcohol abuse   Heart disease Neg Hx    Stroke Neg Hx    Colon cancer Neg Hx    Health Maintenance  Topic Date Due   Pneumococcal Vaccine 76-72 Years old (2 of 2 - PCV) 01/31/2015   Cervical Cancer Screening (HPV/Pap Cotest)  03/06/2023   INFLUENZA VACCINE  07/08/2023   COVID-19 Vaccine (3 - 2024-25 season) 08/08/2023   DTaP/Tdap/Td (3 - Td or Tdap) 02/01/2024   MAMMOGRAM  02/25/2025   Colonoscopy  02/15/2033   Hepatitis C Screening  Completed   HIV Screening  Completed   Zoster Vaccines- Shingrix  Completed   HPV VACCINES  Aged Out     OBJECTIVE:  Vitals:   02/14/24 1510  BP: 133/83  Pulse: 63  Temp: 98.6 F  (37 C)  TempSrc: Oral  SpO2: 95%  Weight: (!) 332 lb 9.6 oz (150.9 kg)  Height: 5\' 2"  (1.575 m)    Physical Exam Vitals reviewed.  Constitutional:      Appearance: Normal appearance. She is obese.  HENT:     Head: Normocephalic.     Right Ear: Tympanic membrane, ear canal and external ear normal.     Left Ear: Tympanic membrane, ear canal and external ear normal.     Nose: Nose normal.     Mouth/Throat:  Mouth: Mucous membranes are moist.  Eyes:     Extraocular Movements: Extraocular movements intact.     Pupils: Pupils are equal, round, and reactive to light.  Cardiovascular:     Rate and Rhythm: Normal rate.  Pulmonary:     Effort: Pulmonary effort is normal.     Breath sounds: Normal breath sounds.  Abdominal:     General: Bowel sounds are normal.     Palpations: Abdomen is soft.  Musculoskeletal:        General: Normal range of motion.     Cervical back: Normal range of motion.  Skin:    General: Skin is warm and dry.  Neurological:     Mental Status: She is alert and oriented to person, place, and time.  Psychiatric:        Mood and Affect: Mood normal.        Behavior: Behavior normal.        Thought Content: Thought content normal.      ROS Comprehensive ROS Pertinent positive and negative noted in HPI   Last 3 Office BP readings: BP Readings from Last 3 Encounters:  02/14/24 133/83  01/03/24 (!) 150/98  06/17/23 (!) 145/80    BMET    Component Value Date/Time   NA 141 01/04/2024 1518   K 4.6 01/04/2024 1518   CL 100 01/04/2024 1518   CO2 24 01/04/2024 1518   GLUCOSE 83 01/04/2024 1518   GLUCOSE 107 (H) 08/04/2019 1346   BUN 8 01/04/2024 1518   CREATININE 0.98 01/04/2024 1518   CREATININE 0.89 01/31/2014 1612   CALCIUM 9.8 01/04/2024 1518   GFRNONAA >60 08/04/2019 1346   GFRAA >60 08/04/2019 1346    Renal function: CrCl cannot be calculated (Patient's most recent lab result is older than the maximum 21 days allowed.).  Clinical  ASCVD: Yes  The 10-year ASCVD risk score (Arnett DK, et al., 2019) is: 11%   Values used to calculate the score:     Age: 4 years     Sex: Female     Is Non-Hispanic African American: Yes     Diabetic: No     Tobacco smoker: Yes     Systolic Blood Pressure: 133 mmHg     Is BP treated: Yes     HDL Cholesterol: 53 mg/dL     Total Cholesterol: 141 mg/dL  ASCVD risk factors include- [place]   ASSESSMENT & PLAN: Essential hypertension 2/2 BP check  -Counseled on lifestyle modifications for blood pressure control including reduced dietary sodium, increased exercise, weight reduction and adequate sleep. Also, educated patient about the risk for cardiovascular events, stroke and heart attack. Also counseled patient about the importance of medication adherence. If you participate in smoking, it is important to stop using tobacco as this will increase the risks associated with uncontrolled blood pressure.  Patient is adherent with current medications.   Goal BP:  For patients younger than 60: Goal BP < 130/80. For patients 60 and older: Goal BP < 140/90. For patients with diabetes: Goal BP < 130/80. Your most recent BP: 133/83  Minimize salt intake. Minimize alcohol intake  Chronic pain of both knees  Followed by ortho  This note has been created with Education officer, environmental. Any transcriptional errors are unintentional.   Grayce Sessions, NP 02/14/2024, 3:53 PM

## 2024-03-27 ENCOUNTER — Telehealth (INDEPENDENT_AMBULATORY_CARE_PROVIDER_SITE_OTHER): Payer: Self-pay | Admitting: Primary Care

## 2024-03-27 NOTE — Telephone Encounter (Signed)
 Called pt to remind them about upcoming appt. VML for pt.

## 2024-04-03 ENCOUNTER — Ambulatory Visit (INDEPENDENT_AMBULATORY_CARE_PROVIDER_SITE_OTHER): Payer: Self-pay | Admitting: Primary Care

## 2024-04-21 ENCOUNTER — Other Ambulatory Visit (INDEPENDENT_AMBULATORY_CARE_PROVIDER_SITE_OTHER): Payer: Self-pay | Admitting: Primary Care

## 2024-04-21 NOTE — Telephone Encounter (Signed)
 Please refill if appropriate

## 2024-04-22 ENCOUNTER — Other Ambulatory Visit (INDEPENDENT_AMBULATORY_CARE_PROVIDER_SITE_OTHER): Payer: Self-pay | Admitting: Primary Care

## 2024-04-22 DIAGNOSIS — Z760 Encounter for issue of repeat prescription: Secondary | ICD-10-CM

## 2024-04-22 DIAGNOSIS — I10 Essential (primary) hypertension: Secondary | ICD-10-CM

## 2024-04-25 NOTE — Telephone Encounter (Signed)
 Requested Prescriptions  Pending Prescriptions Disp Refills   metoprolol  tartrate (LOPRESSOR ) 25 MG tablet [Pharmacy Med Name: Metoprolol  Tartrate 25 MG Oral Tablet] 180 tablet 0    Sig: Take 1 tablet by mouth twice daily     Cardiovascular:  Beta Blockers Passed - 04/25/2024 11:39 AM      Passed - Last BP in normal range    BP Readings from Last 1 Encounters:  02/14/24 133/83         Passed - Last Heart Rate in normal range    Pulse Readings from Last 1 Encounters:  02/14/24 63         Passed - Valid encounter within last 6 months    Recent Outpatient Visits           2 months ago Essential hypertension   [place] [facility] Marius Siemens, NP   3 months ago Mixed hyperlipidemia   [place] [facility] Marius Siemens, NP   1 year ago HYPERTENSION, BENIGN   [place] [facility] Marius Siemens, NP   1 year ago Morbid obesity [facility])   [place] [facility] Marius Siemens, NP   1 year ago Need for immunization against influenza   [place] [facility] Marius Siemens, NP       Future Appointments             In 3 weeks Denece Finger, Meade Spencer, NP [place] [facility]

## 2024-05-16 ENCOUNTER — Telehealth (INDEPENDENT_AMBULATORY_CARE_PROVIDER_SITE_OTHER): Payer: Self-pay | Admitting: Primary Care

## 2024-05-16 NOTE — Telephone Encounter (Signed)
 Called pt to confirm appt. LVM for pt

## 2024-05-17 ENCOUNTER — Ambulatory Visit (INDEPENDENT_AMBULATORY_CARE_PROVIDER_SITE_OTHER): Admitting: Primary Care

## 2024-05-17 ENCOUNTER — Telehealth (INDEPENDENT_AMBULATORY_CARE_PROVIDER_SITE_OTHER): Payer: Self-pay | Admitting: Primary Care

## 2024-05-17 NOTE — Telephone Encounter (Signed)
 Called pt to reschedule appt. Pt did not answer and LVM

## 2024-05-29 ENCOUNTER — Telehealth (INDEPENDENT_AMBULATORY_CARE_PROVIDER_SITE_OTHER): Payer: Self-pay | Admitting: Primary Care

## 2024-05-29 NOTE — Telephone Encounter (Signed)
 Called pt to confirm appt. Pt did not answer and LVM

## 2024-05-30 ENCOUNTER — Telehealth (INDEPENDENT_AMBULATORY_CARE_PROVIDER_SITE_OTHER): Payer: Self-pay | Admitting: Primary Care

## 2024-05-30 ENCOUNTER — Ambulatory Visit (INDEPENDENT_AMBULATORY_CARE_PROVIDER_SITE_OTHER): Admitting: Primary Care

## 2024-05-30 NOTE — Telephone Encounter (Signed)
 Called pt to reschedule appt. Provider will not be in office.

## 2024-06-19 ENCOUNTER — Ambulatory Visit (INDEPENDENT_AMBULATORY_CARE_PROVIDER_SITE_OTHER): Admitting: Primary Care

## 2024-06-22 ENCOUNTER — Ambulatory Visit (INDEPENDENT_AMBULATORY_CARE_PROVIDER_SITE_OTHER): Admitting: Primary Care

## 2024-06-22 ENCOUNTER — Encounter (INDEPENDENT_AMBULATORY_CARE_PROVIDER_SITE_OTHER): Payer: Self-pay | Admitting: Primary Care

## 2024-06-22 VITALS — BP 127/83 | HR 62 | Resp 16 | Wt 327.4 lb

## 2024-06-22 DIAGNOSIS — I10 Essential (primary) hypertension: Secondary | ICD-10-CM

## 2024-06-22 DIAGNOSIS — Z7689 Persons encountering health services in other specified circumstances: Secondary | ICD-10-CM

## 2024-06-22 DIAGNOSIS — G8929 Other chronic pain: Secondary | ICD-10-CM

## 2024-06-22 DIAGNOSIS — E782 Mixed hyperlipidemia: Secondary | ICD-10-CM

## 2024-06-22 DIAGNOSIS — Z79899 Other long term (current) drug therapy: Secondary | ICD-10-CM

## 2024-06-22 DIAGNOSIS — M545 Low back pain, unspecified: Secondary | ICD-10-CM

## 2024-06-22 DIAGNOSIS — Z760 Encounter for issue of repeat prescription: Secondary | ICD-10-CM

## 2024-06-22 MED ORDER — AMLODIPINE BESYLATE 10 MG PO TABS: 10.0000 mg | ORAL_TABLET | Freq: Every day | ORAL | 1 refills | Status: AC

## 2024-06-22 MED ORDER — LIDOCAINE 5 % EX PTCH: 1.0000 | MEDICATED_PATCH | CUTANEOUS | 0 refills | Status: AC

## 2024-06-22 MED ORDER — IBUPROFEN 800 MG PO TABS: 800.0000 mg | ORAL_TABLET | Freq: Three times a day (TID) | ORAL | 0 refills | Status: DC | PRN

## 2024-06-22 MED ORDER — ALBUTEROL SULFATE (2.5 MG/3ML) 0.083% IN NEBU
2.5000 mg | INHALATION_SOLUTION | Freq: Four times a day (QID) | RESPIRATORY_TRACT | 1 refills | Status: AC | PRN
Start: 2024-06-22 — End: ?

## 2024-06-22 MED ORDER — CLONIDINE HCL 0.2 MG PO TABS: 0.2000 mg | ORAL_TABLET | Freq: Every day | ORAL | 1 refills | Status: AC

## 2024-06-22 MED ORDER — LOSARTAN POTASSIUM-HCTZ 100-25 MG PO TABS: 1.0000 | ORAL_TABLET | Freq: Every day | ORAL | 1 refills | Status: AC

## 2024-06-22 MED ORDER — METOPROLOL TARTRATE 25 MG PO TABS: 25.0000 mg | ORAL_TABLET | Freq: Two times a day (BID) | ORAL | 1 refills | Status: AC

## 2024-06-22 NOTE — Patient Instructions (Signed)
 Obesity, Adult Obesity is having too much body fat. Being obese means that your weight is more than what is healthy for you.  BMI (body mass index) is a number that explains how much body fat you have. If you have a BMI of 30 or more, you are obese. Obesity can cause serious health problems, such as: Stroke. Coronary artery disease (CAD). Type 2 diabetes. Some types of cancer. High blood pressure (hypertension). High cholesterol. Gallbladder stones. Obesity can also contribute to: Osteoarthritis. Sleep apnea. Infertility problems. What are the causes? Eating meals each day that are high in calories, sugar, and fat. Drinking a lot of drinks that have sugar in them. Being born with genes that may make you more likely to become obese. Having a medical condition that causes obesity. Taking certain medicines. Sitting a lot (having a sedentary lifestyle). Not getting enough sleep. What increases the risk? Having a family history of obesity. Living in an area with limited access to: [place], recreation centers, or sidewalks. Healthy food choices, such as grocery stores and farmers' markets. What are the signs or symptoms? The main sign is having too much body fat. How is this treated? Treatment for this condition often includes changing your lifestyle. Treatment may include: Changing your diet. This may include making a healthy meal plan. Exercise. This may include activity that causes your heart to beat faster (aerobic exercise) and strength training. Work with your doctor to design a program that works for you. Medicine to help you lose weight. This may be used if you are not able to lose one pound a week after 6 weeks of healthy eating and more exercise. Treating conditions that cause the obesity. Surgery. Options may include gastric banding and gastric bypass. This may be done if: Other treatments have not helped to improve your condition. You have a BMI of 40 or higher. You have  life-threatening health problems related to obesity. Follow these instructions at home: Eating and drinking  Follow advice from your doctor about what to eat and drink. Your doctor may tell you to: Limit fast food, sweets, and processed snack foods. Choose low-fat options. For example, choose low-fat milk instead of whole milk. Eat five or more servings of fruits or vegetables each day. Eat at home more often. This gives you more control over what you eat. Choose healthy foods when you eat out. Learn to read food labels. This will help you learn how much food is in one serving. Keep low-fat snacks available. Avoid drinks that have a lot of sugar in them. These include soda, fruit juice, iced tea with sugar, and flavored milk. Drink enough water to keep your pee (urine) pale yellow. Do not go on fad diets. Physical activity Exercise often, as told by your doctor. Most adults should get up to 150 minutes of moderate-intensity exercise every week.Ask your doctor: What types of exercise are safe for you. How often you should exercise. Warm up and stretch before being active. Do slow stretching after being active (cool down). Rest between times of being active. Lifestyle Work with your doctor and a food expert (dietitian) to set a weight-loss goal that is best for you. Limit your screen time. Find ways to reward yourself that do not involve food. Do not drink alcohol if: Your doctor tells you not to drink. You are pregnant, may be pregnant, or are planning to become pregnant. If you drink alcohol: Limit how much you have to: 0-1 drink a day for women. 0-2 drinks  a day for men. Know how much alcohol is in your drink. In the [place] one drink equals one 12 oz bottle of beer (355 mL), one 5 oz glass of wine (148 mL), or one 1 oz glass of hard liquor (44 mL). General instructions Keep a weight-loss journal. This can help you keep track of: The food that you eat. How much exercise you  get. Take over-the-counter and prescription medicines only as told by your doctor. Take vitamins and supplements only as told by your doctor. Think about joining a support group. Pay attention to your mental health as obesity can lead to depression or self esteem issues. Keep all follow-up visits. Contact a doctor if: You cannot meet your weight-loss goal after you have changed your diet and lifestyle for 6 weeks. You are having trouble breathing. Summary Obesity is having too much body fat. Being obese means that your weight is more than what is healthy for you. Work with your doctor to set a weight-loss goal. Get regular exercise as told by your doctor. This information is not intended to replace advice given to you by your health care provider. Make sure you discuss any questions you have with your health care provider. Document Revised: 07/01/2021 Document Reviewed: 07/01/2021 Elsevier Patient Education  2024 [employer].

## 2024-06-22 NOTE — Progress Notes (Signed)
 Medication Samples have been provided to the patient.  Drug name: Tzhncb   Strength: .25mg         Qty: 4 pens LOT: [id number]  Exp.Date: 11/05/2025  Dosing instructions: Inject 0.25mg  once a week   The patient has been instructed regarding the correct time, dose, and frequency of taking this medication, including desired effects and most common side effects.   [name] 11:48 AM 06/22/2024

## 2024-06-22 NOTE — Progress Notes (Signed)
 [facility]   [name], is a 63 y.o. female presents for a follow up for the management of hypertension - Patient has No headache, No chest pain, No abdominal pain - No Nausea, No new weakness tingling or numbness, No Cough - (shortness of breath chronic  obesity hypoventilation) blood pressure is unremarkable adherent with taking medication daily 127/83. She voices concerns about her weight-this is causing social anxiety, poor self-esteem, and problems with body image.  Discussed starting weight loss medication today since her blood pressure is well-controlled weight loss will also benefit her blood pressure, cholesterol, and respiratory problems.  She also has chronic back pain weight loss may also reduce the pain she has. BP Readings from Last 3 Encounters:  06/22/24 127/83  02/14/24 133/83  01/03/24 (!) 150/98    Wt Readings from Last 3 Encounters:  06/22/24 (!) 327 lb 6.4 oz (148.5 kg)  02/14/24 (!) 332 lb 9.6 oz (150.9 kg)  01/03/24 (!) 337 lb 12.8 oz (153.2 kg)   Medications: Current Outpatient Medications on File Prior to Visit  Medication Sig Dispense Refill   albuterol  (PROVENTIL ) (2.5 MG/3ML) 0.083% nebulizer solution Take 3 mLs (2.5 mg total) by nebulization every 6 (six) hours as needed for wheezing or shortness of breath. 150 mL 1   amLODipine  (NORVASC ) 10 MG tablet Take 1 tablet (10 mg total) by mouth daily. 90 each 1   atorvastatin  (LIPITOR) 10 MG tablet Take 1 tablet (10 mg total) by mouth daily. 90 tablet 1   Blood Pressure Monitoring KIT 1 Bag by Does not apply route 3 (three) times daily. 1 kit 0   Budesonide  (PULMICORT  FLEXHALER) 90 MCG/ACT inhaler Inhale 2 puffs into the lungs 2 (two) times daily. 1 each 3   cloNIDine  (CATAPRES ) 0.2 MG tablet Take 1 tablet (0.2 mg total) by mouth daily. 90 tablet 1   ibuprofen  (ADVIL ) 800 MG tablet TAKE 1 TABLET BY MOUTH EVERY 8 HOURS AS NEEDED 90 tablet 0   losartan -hydrochlorothiazide  (HYZAAR) 100-25  MG tablet Take 1 tablet by mouth daily. 90 tablet 1   metoprolol  tartrate (LOPRESSOR ) 25 MG tablet Take 1 tablet by mouth twice daily 180 tablet 0   No current facility-administered medications on file prior to visit.    ROS:   Denies any headaches, blurred vision, fatigue, shortness of breath, chest pain, abdominal pain, abnormal vaginal discharge/itching/odor/irritation, problems with periods, bowel movements, urination, or intercourse unless otherwise stated above.  Physical exam: General: No apparent distress. Severe morbid obesity  Eyes: Extraocular eye movements intact, pupils equal and round. Neck: Supple, trachea midline. Thyroid: No enlargement, mobile without fixation, no tenderness. Cardiovascular: Regular rhythm and rate, no murmur, normal radial pulses. Respiratory: Normal respiratory effort, clear to auscultation. Gastrointestinal: Normal pitch active bowel sounds, nontender abdomen without distention or appreciable hepatomegaly. Musculoskeletal: chronic back pain increase with walking . Skin: Appropriate warmth, no visible rash. Mental status: Alert, conversant, speech clear, thought logical, appropriate mood and affect, no hallucinations or delusions evident. Hematologic/lymphatic: No cervical adenopathy, no visible ecchymoses.  Vitals:   06/22/24 1126  BP: 127/83  Pulse: 62  Resp: 16  SpO2: 95%   Assessment and Plan: Kitiara was seen today for hyperlipidemia, hypertension and medication management.  Diagnoses and all orders for this visit:  Medication refill -     metoprolol  tartrate (LOPRESSOR ) 25 MG tablet; Take 1 tablet (25 mg total) by mouth 2 (two) times daily. -     losartan -hydrochlorothiazide  (  HYZAAR) 100-25 MG tablet; Take 1 tablet by mouth daily. -     cloNIDine  (CATAPRES ) 0.2 MG tablet; Take 1 tablet (0.2 mg total) by mouth daily. -     amLODipine  (NORVASC ) 10 MG tablet; Take 1 tablet (10 mg total) by mouth daily.  Mixed hyperlipidemia -     Lipid  panel    Essential hypertension -     CBC with Differential/Platelet -     CMP14+EGFR -     metoprolol  tartrate (LOPRESSOR ) 25 MG tablet; Take 1 tablet (25 mg total) by mouth 2 (two) times daily. -     losartan -hydrochlorothiazide  (HYZAAR) 100-25 MG tablet; Take 1 tablet by mouth daily. -     cloNIDine  (CATAPRES ) 0.2 MG tablet; Take 1 tablet (0.2 mg total) by mouth daily. -     amLODipine  (NORVASC ) 10 MG tablet; Take 1 tablet (10 mg total) by mouth daily.  Chronic bilateral low back pain without sciatica (HCC) Followed by neurology   Other orders 2/2Medication management -     ibuprofen  (ADVIL ) 800 MG tablet; Take 1 tablet (800 mg total) by mouth every 8 (eight) hours as needed. -     albuterol  (PROVENTIL ) (2.5 MG/3ML) 0.083% nebulizer solution; Take 3 mLs (2.5 mg total) by nebulization every 6 (six) hours as needed for wheezing or shortness of breath.  Encounter for weight management  2/2 Morbid obesity Obesity with co morbid conditions.  General weight loss/lifestyle modification strategies discussed (elicit support from others; identify saboteurs; non-food rewards, etc). Diet interventions: qualitative changes (increase low-fat,  high-fiber foods). Medication: wegovy . Follow up in: 1 months and as needed.   This note has been created with Education officer, environmental. Any transcriptional errors are unintentional.   Rosaline SHAUNNA Bohr, NP 06/22/2024, 11:29 AM

## 2024-07-16 ENCOUNTER — Other Ambulatory Visit (INDEPENDENT_AMBULATORY_CARE_PROVIDER_SITE_OTHER): Payer: Self-pay | Admitting: Primary Care

## 2024-07-19 ENCOUNTER — Telehealth (INDEPENDENT_AMBULATORY_CARE_PROVIDER_SITE_OTHER): Payer: Self-pay | Admitting: Primary Care

## 2024-07-19 NOTE — Telephone Encounter (Signed)
 Called pt to confirm appt. Pt did not answer and LVM

## 2024-07-19 NOTE — Telephone Encounter (Signed)
 Requested Prescriptions  Pending Prescriptions Disp Refills   atorvastatin  (LIPITOR) 10 MG tablet [Pharmacy Med Name: Atorvastatin  Calcium  10 MG Oral Tablet] 90 tablet 0    Sig: Take 1 tablet by mouth once daily     Cardiovascular:  Antilipid - Statins Failed - 07/19/2024  1:29 PM      Failed - Lipid Panel in normal range within the last 12 months    Cholesterol, Total  Date Value Ref Range Status  01/04/2024 141 100 - 199 mg/dL Final   LDL Chol Calc (NIH)  Date Value Ref Range Status  01/04/2024 65 0 - 99 mg/dL Final   HDL  Date Value Ref Range Status  01/04/2024 53 >39 mg/dL Final   Triglycerides  Date Value Ref Range Status  01/04/2024 131 0 - 149 mg/dL Final         Passed - Patient is not pregnant      Passed - Valid encounter within last 12 months    Recent Outpatient Visits           3 weeks ago Medication refill   [place] [facility] Celestia Rosaline SQUIBB, NP   5 months ago Essential hypertension   [place] [facility] Celestia Rosaline SQUIBB, NP   6 months ago Mixed hyperlipidemia   [place] [facility] Celestia Rosaline SQUIBB, NP   1 year ago HYPERTENSION, BENIGN   [place] [facility] Celestia Rosaline SQUIBB, NP   1 year ago Morbid obesity [facility])   [place] [facility] Celestia Rosaline SQUIBB, NP

## 2024-07-20 ENCOUNTER — Ambulatory Visit (INDEPENDENT_AMBULATORY_CARE_PROVIDER_SITE_OTHER): Admitting: Primary Care

## 2024-07-20 ENCOUNTER — Encounter (INDEPENDENT_AMBULATORY_CARE_PROVIDER_SITE_OTHER): Payer: Self-pay

## 2024-07-20 DIAGNOSIS — E66813 Obesity, class 3: Secondary | ICD-10-CM

## 2024-07-21 ENCOUNTER — Ambulatory Visit (INDEPENDENT_AMBULATORY_CARE_PROVIDER_SITE_OTHER): Payer: Self-pay | Admitting: Primary Care

## 2024-07-21 LAB — LIPID PANEL
Chol/HDL Ratio: 3.2 ratio (ref 0.0–4.4)
Cholesterol, Total: 163 mg/dL (ref 100–199)
HDL: 51 mg/dL (ref >39)
LDL Chol Calc (NIH): 80 mg/dL (ref 0–99)
Triglycerides: 188 mg/dL — ABNORMAL HIGH (ref 0–149)
VLDL Cholesterol Cal: 32 mg/dL (ref 5–40)

## 2024-07-21 LAB — CMP14+EGFR
ALT: 19 IU/L (ref 0–32)
AST: 19 IU/L (ref 0–40)
Albumin: 4.1 g/dL (ref 3.9–4.9)
Alkaline Phosphatase: 101 IU/L (ref 44–121)
BUN/Creatinine Ratio: 12 (ref 12–28)
BUN: 12 mg/dL (ref 8–27)
Bilirubin Total: 0.3 mg/dL (ref 0.0–1.2)
CO2: 22 mmol/L (ref 20–29)
Calcium: 9.7 mg/dL (ref 8.7–10.3)
Chloride: 102 mmol/L (ref 96–106)
Creatinine, Ser: 1.02 mg/dL — ABNORMAL HIGH (ref 0.57–1.00)
Globulin, Total: 2.9 g/dL (ref 1.5–4.5)
Glucose: 92 mg/dL (ref 70–99)
Potassium: 3.8 mmol/L (ref 3.5–5.2)
Sodium: 142 mmol/L (ref 134–144)
Total Protein: 7 g/dL (ref 6.0–8.5)
eGFR: 63 mL/min/1.73 (ref >59)

## 2024-07-21 LAB — CBC WITH DIFFERENTIAL/PLATELET
Basophils Absolute: 0 x10E3/uL (ref 0.0–0.2)
Basos: 0 %
EOS (ABSOLUTE): 0.2 x10E3/uL (ref 0.0–0.4)
Eos: 2 %
Hematocrit: 46.7 % — ABNORMAL HIGH (ref 34.0–46.6)
Hemoglobin: 14.4 g/dL (ref 11.1–15.9)
Immature Grans (Abs): 0 x10E3/uL (ref 0.0–0.1)
Immature Granulocytes: 0 %
Lymphocytes Absolute: 2.7 x10E3/uL (ref 0.7–3.1)
Lymphs: 34 %
MCH: 25.5 pg — ABNORMAL LOW (ref 26.6–33.0)
MCHC: 30.8 g/dL — ABNORMAL LOW (ref 31.5–35.7)
MCV: 83 fL (ref 79–97)
Monocytes Absolute: 0.8 x10E3/uL (ref 0.1–0.9)
Monocytes: 10 %
Neutrophils Absolute: 4.2 x10E3/uL (ref 1.4–7.0)
Neutrophils: 54 %
Platelets: 197 x10E3/uL (ref 150–450)
RBC: 5.64 x10E6/uL — ABNORMAL HIGH (ref 3.77–5.28)
RDW: 15.1 % (ref 11.7–15.4)
WBC: 7.9 x10E3/uL (ref 3.4–10.8)

## 2024-07-21 MED ORDER — WEGOVY 0.5 MG/0.5ML ~~LOC~~ SOAJ: 0.5000 mg | SUBCUTANEOUS | 1 refills | Status: DC

## 2024-07-21 MED ORDER — WEGOVY 0.5 MG/0.5ML ~~LOC~~ SOAJ: 0.5000 mg | SUBCUTANEOUS | 1 refills | Status: AC

## 2024-09-11 ENCOUNTER — Telehealth (INDEPENDENT_AMBULATORY_CARE_PROVIDER_SITE_OTHER): Payer: Self-pay

## 2024-09-11 NOTE — Telephone Encounter (Unsigned)
 Copied from CRM #[id number]. Topic: General - Other >> Sep 08, 2024  3:44 PM Montie POUR wrote: Reason for CRM:  Joetta needs paperwork completed for her work by 09/18/24 or she can't work until it is completed. She will bring paperwork in the morning on 09/1024, please call her to let her know and discuss when paperwork can be completed. Her number is [phone]

## 2024-09-19 ENCOUNTER — Telehealth (INDEPENDENT_AMBULATORY_CARE_PROVIDER_SITE_OTHER): Payer: Self-pay

## 2024-09-19 ENCOUNTER — Telehealth (INDEPENDENT_AMBULATORY_CARE_PROVIDER_SITE_OTHER): Payer: Self-pay | Admitting: Primary Care

## 2024-09-19 ENCOUNTER — Ambulatory Visit (INDEPENDENT_AMBULATORY_CARE_PROVIDER_SITE_OTHER): Admitting: Primary Care

## 2024-09-19 NOTE — Telephone Encounter (Signed)
 Pt dropped off paperwork please reach out to pt and schedule an appt

## 2024-09-19 NOTE — Telephone Encounter (Signed)
 Error

## 2024-09-19 NOTE — Telephone Encounter (Signed)
 Called pt to schedule appt. If pt does call back please call office and let me schedule appt. Please advise

## 2024-09-19 NOTE — Telephone Encounter (Signed)
 Pt called almost a week ago early Oct to ask about paperwork process and how could it get completed when our office is not operating and running. Pt did bring paperwork in to be processed and completed.

## 2024-09-20 ENCOUNTER — Ambulatory Visit (INDEPENDENT_AMBULATORY_CARE_PROVIDER_SITE_OTHER): Admitting: Primary Care

## 2024-09-20 ENCOUNTER — Encounter (INDEPENDENT_AMBULATORY_CARE_PROVIDER_SITE_OTHER): Payer: Self-pay | Admitting: Primary Care

## 2024-09-20 VITALS — BP 113/75 | HR 69 | Resp 16 | Wt 326.2 lb

## 2024-09-20 DIAGNOSIS — Z23 Encounter for immunization: Secondary | ICD-10-CM

## 2024-09-20 DIAGNOSIS — M174 Other bilateral secondary osteoarthritis of knee: Secondary | ICD-10-CM

## 2024-09-20 DIAGNOSIS — M25562 Pain in left knee: Secondary | ICD-10-CM

## 2024-09-20 DIAGNOSIS — M25561 Pain in right knee: Secondary | ICD-10-CM

## 2024-09-20 DIAGNOSIS — G8929 Other chronic pain: Secondary | ICD-10-CM

## 2024-09-20 NOTE — Progress Notes (Signed)
 [facility]  [name], is a 63 y.o. female  [id number]  [id number]  DOB - 12-27-1960  Chief Complaint  Patient presents with   Form Completion       Subjective:   [name] is a 63 y.o. female here today for an acute visit.  HPI  No problems updated.  Comprehensive ROS Pertinent positive and negative noted in HPI   No Known Allergies  Past Medical History:  Diagnosis Date   Asthma    Hypertension    Left leg DVT (HCC) 12/07/2004   s/p C-section, 1 month hospitaliation for staph infection and period of immobility   Obesity    Staphylococcal infection of skin 12/07/2004   1 month hospitalization, s/p C section    Current Outpatient Medications on File Prior to Visit  Medication Sig Dispense Refill   albuterol  (PROVENTIL ) (2.5 MG/3ML) 0.083% nebulizer solution Take 3 mLs (2.5 mg total) by nebulization every 6 (six) hours as needed for wheezing or shortness of breath. 150 mL 1   amLODipine  (NORVASC ) 10 MG tablet Take 1 tablet (10 mg total) by mouth daily. 90 tablet 1   atorvastatin  (LIPITOR) 10 MG tablet Take 1 tablet by mouth once daily 90 tablet 0   Blood Pressure Monitoring KIT 1 Bag by Does not apply route 3 (three) times daily. 1 kit 0   Budesonide  (PULMICORT  FLEXHALER) 90 MCG/ACT inhaler Inhale 2 puffs into the lungs 2 (two) times daily. 1 each 3   cloNIDine  (CATAPRES ) 0.2 MG tablet Take 1 tablet (0.2 mg total) by mouth daily. 90 tablet 1   ibuprofen  (ADVIL ) 800 MG tablet Take 1 tablet (800 mg total) by mouth every 8 (eight) hours as needed. 90 tablet 0   lidocaine  (LIDODERM ) 5 % Place 1 patch onto the skin daily. Remove & Discard patch within 12 hours or as directed by MD 30 patch 0   losartan -hydrochlorothiazide  (HYZAAR) 100-25 MG tablet Take 1 tablet by mouth daily. 90 tablet 1   metoprolol  tartrate (LOPRESSOR ) 25 MG tablet Take 1 tablet (25 mg total) by mouth 2 (two) times daily. 180 tablet 1   semaglutide -weight management  (WEGOVY ) 0.5 MG/0.5ML SOAJ SQ injection Inject 0.5 mg into the skin once a week. 2 mL 1   No current facility-administered medications on file prior to visit.   Health Maintenance  Topic Date Due   COVID-19 Vaccine (3 - Moderna risk series) 04/16/2020   Pap with HPV screening  03/06/2023   DTaP/Tdap/Td vaccine (3 - Td or Tdap) 02/01/2024   Breast Cancer Screening  02/25/2025   Colon Cancer Screening  02/15/2033   Pneumococcal Vaccine for age over 56  Completed   Flu Shot  Completed   Hepatitis C Screening  Completed   HIV Screening  Completed   Zoster (Shingles) Vaccine  Completed   Hepatitis B Vaccine  Aged Out   HPV Vaccine  Aged Out   Meningitis B Vaccine  Aged Out    Objective:   Vitals:   09/20/24 0857  BP: 113/75  Pulse: 69  Resp: 16  SpO2: 100%  Weight: (!) 326 lb 3.2 oz (148 kg)     Physical Exam Vitals reviewed.  Constitutional:      Appearance: Normal appearance. She is obese.  HENT:     Head: Normocephalic.     Right Ear: Tympanic membrane, ear canal and external ear normal.     Left Ear: Tympanic membrane, ear canal and external ear normal.     Ears:  Comments: Rt canal red bobby pen d/w risk ear drum    Nose: Nose normal.     Mouth/Throat:     Mouth: Mucous membranes are moist.  Eyes:     Extraocular Movements: Extraocular movements intact.     Pupils: Pupils are equal, round, and reactive to light.  Cardiovascular:     Rate and Rhythm: Normal rate.  Pulmonary:     Effort: Pulmonary effort is normal.     Breath sounds: Normal breath sounds.  Abdominal:     General: Bowel sounds are normal.     Palpations: Abdomen is soft.  Musculoskeletal:        General: Normal range of motion.     Cervical back: Normal range of motion.     Comments: Right knee swollen mass palpitated sore/pain   Skin:    General: Skin is warm and dry.  Neurological:     Mental Status: She is alert and oriented to person, place, and time.  Psychiatric:        Mood and  Affect: Mood normal.        Behavior: Behavior normal.        Thought Content: Thought content normal.       Assessment & Plan  [name] was seen today for form completion.  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 Other issues discussed during this visit include: low cholesterol diet, weight control and daily exercise, foot care, annual eye examinations at Ophthalmology, importance of adherence with medications and regular follow-up. We also discussed long term complications of uncontrolled diabetes and hypertension.   No follow-ups on file.  The patient was given clear instructions to go to ER or return to medical center if symptoms don't improve, worsen or new problems develop. The patient verbalized understanding. The patient was told to call to get lab results if they haven't heard anything in the next week.   This note has been created with Education officer, environmental. Any transcriptional errors are unintentional.   Alexis SHAUNNA Bohr, NP 09/20/2024, 9:30 AM

## 2024-09-20 NOTE — Patient Instructions (Signed)

## 2024-10-09 ENCOUNTER — Encounter: Payer: Self-pay | Admitting: Radiology

## 2024-10-10 ENCOUNTER — Other Ambulatory Visit (INDEPENDENT_AMBULATORY_CARE_PROVIDER_SITE_OTHER): Payer: Self-pay | Admitting: Primary Care

## 2024-10-14 ENCOUNTER — Other Ambulatory Visit (INDEPENDENT_AMBULATORY_CARE_PROVIDER_SITE_OTHER): Payer: Self-pay | Admitting: Primary Care

## 2024-10-16 NOTE — Telephone Encounter (Signed)
 Requested Prescriptions  Pending Prescriptions Disp Refills   atorvastatin  (LIPITOR) 10 MG tablet [Pharmacy Med Name: Atorvastatin  Calcium  10 MG Oral Tablet] 90 tablet 0    Sig: Take 1 tablet by mouth once daily     Cardiovascular:  Antilipid - Statins Failed - 10/16/2024  1:52 PM      Failed - Lipid Panel in normal range within the last 12 months    Cholesterol, Total  Date Value Ref Range Status  07/20/2024 163 100 - 199 mg/dL Final   LDL Chol Calc (NIH)  Date Value Ref Range Status  07/20/2024 80 0 - 99 mg/dL Final   HDL  Date Value Ref Range Status  07/20/2024 51 >39 mg/dL Final   Triglycerides  Date Value Ref Range Status  07/20/2024 188 (H) 0 - 149 mg/dL Final         Passed - Patient is not pregnant      Passed - Valid encounter within last 12 months    Recent Outpatient Visits           3 weeks ago Encounter for immunization   [place] [facility] Celestia Rosaline SQUIBB, NP   3 months ago Medication refill   [place] [facility] Celestia Rosaline SQUIBB, NP   8 months ago Essential hypertension   [place] [facility] Celestia Rosaline SQUIBB, NP   9 months ago Mixed hyperlipidemia   [place] [facility] Celestia Rosaline SQUIBB, NP   1 year ago HYPERTENSION, BENIGN   [place] [facility] Celestia Rosaline SQUIBB, NP
